# Patient Record
Sex: Female | Born: 1935 | Race: White | Hispanic: No | Smoking: Former smoker
Health system: Southern US, Community
[De-identification: ages and names within clinical notes are randomized; demographics above are authoritative.]

## PROBLEM LIST (undated history)

## (undated) DIAGNOSIS — I10 Essential (primary) hypertension: Secondary | ICD-10-CM

## (undated) DIAGNOSIS — M069 Rheumatoid arthritis, unspecified: Secondary | ICD-10-CM

## (undated) DIAGNOSIS — J449 Chronic obstructive pulmonary disease, unspecified: Secondary | ICD-10-CM

## (undated) HISTORY — DX: Essential (primary) hypertension: I10

## (undated) HISTORY — DX: Chronic obstructive pulmonary disease, unspecified: J44.9

## (undated) HISTORY — DX: Rheumatoid arthritis, unspecified: M06.9

---

## 2004-11-28 ENCOUNTER — Ambulatory Visit: Payer: Self-pay | Admitting: Ophthalmology

## 2004-12-02 ENCOUNTER — Ambulatory Visit: Payer: Self-pay | Admitting: Ophthalmology

## 2005-01-27 ENCOUNTER — Other Ambulatory Visit: Payer: Self-pay

## 2005-01-27 ENCOUNTER — Emergency Department: Payer: Self-pay | Admitting: Emergency Medicine

## 2006-02-09 ENCOUNTER — Emergency Department: Payer: Self-pay | Admitting: Emergency Medicine

## 2006-02-09 ENCOUNTER — Other Ambulatory Visit: Payer: Self-pay

## 2006-07-03 ENCOUNTER — Other Ambulatory Visit: Payer: Self-pay

## 2006-07-03 ENCOUNTER — Inpatient Hospital Stay: Payer: Self-pay | Admitting: Internal Medicine

## 2008-03-08 ENCOUNTER — Ambulatory Visit: Payer: Self-pay

## 2008-03-14 ENCOUNTER — Ambulatory Visit: Payer: Self-pay

## 2009-07-04 ENCOUNTER — Encounter: Payer: Self-pay | Admitting: Internal Medicine

## 2009-07-24 ENCOUNTER — Encounter: Payer: Self-pay | Admitting: Internal Medicine

## 2011-08-09 ENCOUNTER — Emergency Department: Payer: Self-pay | Admitting: *Deleted

## 2012-02-20 ENCOUNTER — Ambulatory Visit: Payer: Self-pay | Admitting: Internal Medicine

## 2012-02-20 LAB — URINALYSIS, COMPLETE
Bacteria: NONE SEEN
Bilirubin,UR: NEGATIVE
Blood: NEGATIVE
Glucose,UR: NEGATIVE mg/dL
Ketone: NEGATIVE
Leukocyte Esterase: NEGATIVE
Nitrite: NEGATIVE
Ph: 7
Protein: NEGATIVE
RBC,UR: 1 /HPF
Specific Gravity: 1.008
Squamous Epithelial: <1
WBC UR: 1 /HPF

## 2012-02-22 LAB — URINE CULTURE

## 2012-05-20 ENCOUNTER — Inpatient Hospital Stay: Payer: Self-pay | Admitting: Internal Medicine

## 2012-05-20 LAB — CBC
HCT: 43.8 % (ref 35.0–47.0)
MCHC: 34.1 g/dL (ref 32.0–36.0)
MCV: 94 fL (ref 80–100)
RBC: 4.64 10*6/uL (ref 3.80–5.20)
RDW: 12.9 % (ref 11.5–14.5)

## 2012-05-20 LAB — COMPREHENSIVE METABOLIC PANEL
Albumin: 4.6 g/dL (ref 3.4–5.0)
Alkaline Phosphatase: 79 U/L (ref 50–136)
Anion Gap: 9 (ref 7–16)
BUN: 13 mg/dL (ref 7–18)
Bilirubin,Total: 0.6 mg/dL (ref 0.2–1.0)
Chloride: 99 mmol/L (ref 98–107)
Co2: 28 mmol/L (ref 21–32)
Creatinine: 0.83 mg/dL (ref 0.60–1.30)
EGFR (Non-African Amer.): >60
Glucose: 116 mg/dL — ABNORMAL HIGH (ref 65–99)
Osmolality: 273 (ref 275–301)
Potassium: 3 mmol/L — ABNORMAL LOW (ref 3.5–5.1)
SGOT(AST): 35 U/L (ref 15–37)
Sodium: 136 mmol/L (ref 136–145)
Total Protein: 8.1 g/dL (ref 6.4–8.2)

## 2012-05-20 LAB — URINALYSIS, COMPLETE
Bilirubin,UR: NEGATIVE
Blood: NEGATIVE
Ketone: NEGATIVE
Leukocyte Esterase: NEGATIVE
Nitrite: NEGATIVE
Ph: 8 (ref 4.5–8.0)
Protein: 30
Specific Gravity: 1.004 (ref 1.003–1.030)
WBC UR: 1 /HPF (ref 0–5)

## 2012-05-20 LAB — TROPONIN I: Troponin-I: 0.03 ng/mL

## 2012-05-22 LAB — CBC WITH DIFFERENTIAL/PLATELET
Basophil #: 0 10*3/uL (ref 0.0–0.1)
Basophil %: 0.1 %
Eosinophil %: 0 %
HCT: 32.8 % — ABNORMAL LOW (ref 35.0–47.0)
Lymphocyte #: 0.6 10*3/uL — ABNORMAL LOW (ref 1.0–3.6)
Lymphocyte %: 8.4 %
MCHC: 33.7 g/dL (ref 32.0–36.0)
Monocyte %: 2.3 %
Neutrophil #: 6.6 10*3/uL — ABNORMAL HIGH (ref 1.4–6.5)
Platelet: 177 10*3/uL (ref 150–440)
RBC: 3.39 10*6/uL — ABNORMAL LOW (ref 3.80–5.20)
RDW: 12.9 % (ref 11.5–14.5)

## 2012-05-22 LAB — COMPREHENSIVE METABOLIC PANEL
Albumin: 3 g/dL — ABNORMAL LOW (ref 3.4–5.0)
Alkaline Phosphatase: 51 U/L (ref 50–136)
Anion Gap: 11 (ref 7–16)
BUN: 17 mg/dL (ref 7–18)
Co2: 21 mmol/L (ref 21–32)
EGFR (Non-African Amer.): 39 — ABNORMAL LOW
Glucose: 248 mg/dL — ABNORMAL HIGH (ref 65–99)
Potassium: 5.2 mmol/L — ABNORMAL HIGH (ref 3.5–5.1)
SGOT(AST): 19 U/L (ref 15–37)

## 2012-05-23 LAB — CBC WITH DIFFERENTIAL/PLATELET
Basophil #: 0 10*3/uL (ref 0.0–0.1)
Basophil %: 0.1 %
Eosinophil #: 0 10*3/uL (ref 0.0–0.7)
Eosinophil %: 0.2 %
HCT: 36.2 % (ref 35.0–47.0)
HGB: 12.1 g/dL (ref 12.0–16.0)
Lymphocyte %: 10.3 %
MCH: 32.3 pg (ref 26.0–34.0)
MCHC: 33.5 g/dL (ref 32.0–36.0)
MCV: 96 fL (ref 80–100)
Monocyte #: 0.8 x10 3/mm (ref 0.2–0.9)
Monocyte %: 6.7 %
Neutrophil #: 9.4 10*3/uL — ABNORMAL HIGH (ref 1.4–6.5)
RBC: 3.76 10*6/uL — ABNORMAL LOW (ref 3.80–5.20)
WBC: 11.3 10*3/uL — ABNORMAL HIGH (ref 3.6–11.0)

## 2012-05-23 LAB — BASIC METABOLIC PANEL
Anion Gap: 8 (ref 7–16)
Calcium, Total: 8.7 mg/dL (ref 8.5–10.1)
Chloride: 101 mmol/L (ref 98–107)
Co2: 28 mmol/L (ref 21–32)
EGFR (African American): >60
EGFR (Non-African Amer.): >60
Osmolality: 276 (ref 275–301)
Potassium: 4 mmol/L (ref 3.5–5.1)

## 2012-05-24 LAB — BASIC METABOLIC PANEL
Calcium, Total: 8.9 mg/dL (ref 8.5–10.1)
Chloride: 99 mmol/L (ref 98–107)
Co2: 28 mmol/L (ref 21–32)
Creatinine: 0.87 mg/dL (ref 0.60–1.30)
EGFR (African American): >60
EGFR (Non-African Amer.): >60
Glucose: 99 mg/dL (ref 65–99)
Potassium: 3.6 mmol/L (ref 3.5–5.1)
Sodium: 136 mmol/L (ref 136–145)

## 2012-05-24 LAB — URINALYSIS, COMPLETE
Bacteria: NONE SEEN
Glucose,UR: NEGATIVE mg/dL (ref 0–75)
Ketone: NEGATIVE
Specific Gravity: 1.004 (ref 1.003–1.030)
Squamous Epithelial: NONE SEEN
WBC UR: <1 /HPF (ref 0–5)

## 2012-05-25 ENCOUNTER — Encounter: Payer: Self-pay | Admitting: Internal Medicine

## 2012-05-26 LAB — URINE CULTURE

## 2012-10-31 ENCOUNTER — Ambulatory Visit: Payer: Self-pay | Admitting: Ophthalmology

## 2014-08-09 LAB — COMPREHENSIVE METABOLIC PANEL
ALT: 19 U/L
ANION GAP: 8 (ref 7–16)
AST: 41 U/L — AB (ref 15–37)
Albumin: 4.2 g/dL (ref 3.4–5.0)
Alkaline Phosphatase: 65 U/L
BILIRUBIN TOTAL: 0.4 mg/dL (ref 0.2–1.0)
BUN: 20 mg/dL — AB (ref 7–18)
CHLORIDE: 94 mmol/L — AB (ref 98–107)
CO2: 33 mmol/L — AB (ref 21–32)
CREATININE: 0.77 mg/dL (ref 0.60–1.30)
Calcium, Total: 9.4 mg/dL (ref 8.5–10.1)
EGFR (African American): >60
Glucose: 138 mg/dL — ABNORMAL HIGH (ref 65–99)
Osmolality: 275 (ref 275–301)
Potassium: 3.4 mmol/L — ABNORMAL LOW (ref 3.5–5.1)
SODIUM: 135 mmol/L — AB (ref 136–145)
Total Protein: 7.9 g/dL (ref 6.4–8.2)

## 2014-08-09 LAB — CBC
HCT: 36.3 % (ref 35.0–47.0)
HGB: 12.1 g/dL (ref 12.0–16.0)
MCH: 31.4 pg (ref 26.0–34.0)
MCHC: 33.2 g/dL (ref 32.0–36.0)
MCV: 95 fL (ref 80–100)
PLATELETS: 171 10*3/uL (ref 150–440)
RBC: 3.84 10*6/uL (ref 3.80–5.20)
RDW: 12.3 % (ref 11.5–14.5)
WBC: 7.4 10*3/uL (ref 3.6–11.0)

## 2014-08-09 LAB — TROPONIN I: Troponin-I: 0.08 ng/mL — ABNORMAL HIGH

## 2014-08-09 LAB — PROTIME-INR
INR: 1
Prothrombin Time: 12.6 secs (ref 11.5–14.7)

## 2014-08-09 LAB — APTT: Activated PTT: 28.9 secs (ref 23.6–35.9)

## 2014-08-09 LAB — CK TOTAL AND CKMB (NOT AT ARMC)
CK, Total: 118 U/L
CK-MB: 2 ng/mL (ref 0.5–3.6)

## 2014-08-10 ENCOUNTER — Observation Stay: Payer: Self-pay | Admitting: Internal Medicine

## 2014-08-10 DIAGNOSIS — I059 Rheumatic mitral valve disease, unspecified: Secondary | ICD-10-CM

## 2014-08-10 DIAGNOSIS — I10 Essential (primary) hypertension: Secondary | ICD-10-CM

## 2014-08-10 DIAGNOSIS — I200 Unstable angina: Secondary | ICD-10-CM

## 2014-08-10 LAB — URINALYSIS, COMPLETE
Bacteria: NONE SEEN
Bilirubin,UR: NEGATIVE
Blood: NEGATIVE
Glucose,UR: NEGATIVE mg/dL (ref 0–75)
Ketone: NEGATIVE
Leukocyte Esterase: NEGATIVE
Nitrite: NEGATIVE
PH: 7 (ref 4.5–8.0)
Protein: NEGATIVE
RBC,UR: NONE SEEN /HPF (ref 0–5)
SPECIFIC GRAVITY: 1.006 (ref 1.003–1.030)
Squamous Epithelial: NONE SEEN

## 2014-08-10 LAB — CK-MB
CK-MB: 2 ng/mL (ref 0.5–3.6)
CK-MB: 2.1 ng/mL (ref 0.5–3.6)

## 2014-08-10 LAB — HEPARIN LEVEL (UNFRACTIONATED)
Anti-Xa(Unfractionated): 0.73 IU/mL — ABNORMAL HIGH (ref 0.30–0.70)
Anti-Xa(Unfractionated): 0.41 IU/mL (ref 0.30–0.70)

## 2014-08-10 LAB — TROPONIN I
Troponin-I: 0.09 ng/mL — ABNORMAL HIGH
Troponin-I: 0.07 ng/mL — ABNORMAL HIGH

## 2014-08-11 LAB — CBC WITH DIFFERENTIAL/PLATELET
Basophil #: 0 10*3/uL (ref 0.0–0.1)
Basophil %: 0.5 %
Eosinophil #: 0.2 10*3/uL (ref 0.0–0.7)
Eosinophil %: 4.6 %
HCT: 28.2 % — ABNORMAL LOW (ref 35.0–47.0)
HGB: 9.1 g/dL — AB (ref 12.0–16.0)
Lymphocyte #: 1 10*3/uL (ref 1.0–3.6)
Lymphocyte %: 24.1 %
MCH: 31.5 pg (ref 26.0–34.0)
MCHC: 32.4 g/dL (ref 32.0–36.0)
MCV: 97 fL (ref 80–100)
MONOS PCT: 11 %
Monocyte #: 0.5 x10 3/mm (ref 0.2–0.9)
Neutrophil #: 2.5 10*3/uL (ref 1.4–6.5)
Neutrophil %: 59.8 %
Platelet: 135 10*3/uL — ABNORMAL LOW (ref 150–440)
RBC: 2.9 10*6/uL — AB (ref 3.80–5.20)
RDW: 12.6 % (ref 11.5–14.5)
WBC: 4.1 10*3/uL (ref 3.6–11.0)

## 2014-08-11 LAB — HEPARIN LEVEL (UNFRACTIONATED): ANTI-XA(UNFRACTIONATED): 0.36 [IU]/mL (ref 0.30–0.70)

## 2014-08-11 LAB — HEMOGLOBIN: HGB: 9.9 g/dL — ABNORMAL LOW (ref 12.0–16.0)

## 2014-08-13 ENCOUNTER — Encounter: Payer: Self-pay | Admitting: Cardiovascular Disease

## 2014-10-01 ENCOUNTER — Emergency Department: Payer: Self-pay | Admitting: Emergency Medicine

## 2014-10-01 LAB — URINALYSIS, COMPLETE
BACTERIA: NONE SEEN
Bilirubin,UR: NEGATIVE
Blood: NEGATIVE
Glucose,UR: NEGATIVE mg/dL (ref 0–75)
Ketone: NEGATIVE
LEUKOCYTE ESTERASE: NEGATIVE
Nitrite: NEGATIVE
Ph: 7 (ref 4.5–8.0)
Protein: NEGATIVE
SQUAMOUS EPITHELIAL: NONE SEEN
Specific Gravity: 1.008 (ref 1.003–1.030)
WBC UR: <1 /HPF (ref 0–5)

## 2014-10-01 LAB — COMPREHENSIVE METABOLIC PANEL
ALBUMIN: 4.5 g/dL (ref 3.4–5.0)
Alkaline Phosphatase: 61 U/L
Anion Gap: 7 (ref 7–16)
BUN: 12 mg/dL (ref 7–18)
Bilirubin,Total: 0.5 mg/dL (ref 0.2–1.0)
CHLORIDE: 95 mmol/L — AB (ref 98–107)
CO2: 34 mmol/L — AB (ref 21–32)
Calcium, Total: 9.8 mg/dL (ref 8.5–10.1)
Creatinine: 0.85 mg/dL (ref 0.60–1.30)
Glucose: 138 mg/dL — ABNORMAL HIGH (ref 65–99)
OSMOLALITY: 274 (ref 275–301)
Potassium: 3.5 mmol/L (ref 3.5–5.1)
SGOT(AST): 33 U/L (ref 15–37)
SGPT (ALT): 20 U/L
Sodium: 136 mmol/L (ref 136–145)
TOTAL PROTEIN: 7.5 g/dL (ref 6.4–8.2)

## 2014-10-01 LAB — CBC
HCT: 37.2 % (ref 35.0–47.0)
HGB: 12.3 g/dL (ref 12.0–16.0)
MCH: 31.7 pg (ref 26.0–34.0)
MCHC: 33.1 g/dL (ref 32.0–36.0)
MCV: 96 fL (ref 80–100)
Platelet: 176 10*3/uL (ref 150–440)
RBC: 3.88 10*6/uL (ref 3.80–5.20)
RDW: 12.9 % (ref 11.5–14.5)
WBC: 5 10*3/uL (ref 3.6–11.0)

## 2014-10-01 LAB — TROPONIN I
TROPONIN-I: 0.03 ng/mL
Troponin-I: 0.02 ng/mL

## 2014-10-08 ENCOUNTER — Telehealth: Payer: Self-pay

## 2014-10-08 NOTE — Telephone Encounter (Signed)
Attempted to call pt for Ed follow up, line continual busy

## 2014-10-08 NOTE — Telephone Encounter (Signed)
-----   Message from [place] OuchMuhammad A Arida, MD sent at 10/01/2014  1:27 PM EST ----- Needs follow up within 1 week for chest pain. She is in ED now.

## 2014-10-10 ENCOUNTER — Ambulatory Visit (INDEPENDENT_AMBULATORY_CARE_PROVIDER_SITE_OTHER): Payer: Medicare Other | Admitting: Cardiovascular Disease

## 2014-10-10 ENCOUNTER — Encounter: Payer: Self-pay | Admitting: Cardiovascular Disease

## 2014-10-10 ENCOUNTER — Encounter (INDEPENDENT_AMBULATORY_CARE_PROVIDER_SITE_OTHER): Payer: Self-pay

## 2014-10-10 VITALS — BP 150/86 | HR 80 | Ht 61.0 in | Wt 114.8 lb

## 2014-10-10 DIAGNOSIS — I10 Essential (primary) hypertension: Secondary | ICD-10-CM

## 2014-10-10 DIAGNOSIS — R079 Chest pain, unspecified: Secondary | ICD-10-CM | POA: Insufficient documentation

## 2014-10-10 DIAGNOSIS — I209 Angina pectoris, unspecified: Secondary | ICD-10-CM

## 2014-10-10 MED ORDER — ISOSORBIDE MONONITRATE ER 30 MG PO TB24: 30.0000 mg | ORAL_TABLET | Freq: Every day | ORAL | Status: DC

## 2014-10-10 MED ORDER — ISOSORBIDE MONONITRATE ER 30 MG PO TB24: 30.0000 mg | ORAL_TABLET | Freq: Every day | ORAL | Status: AC

## 2014-10-10 NOTE — Progress Notes (Signed)
   HPI  This is an 80 year old female who is here today for a follow-up visit regarding chest pain. She reports previous history of pericarditis many years ago. She has known history of COPD and rheumatoid arthritis. She lives in an assisted living facility. She was hospitalized in September for substernal chest tightness and shortness of breath . She was noted to be hypertensive on presentation and was found to have borderline elevated troponin . EKG showed no ischemic changes. Given her age and frail status, she was treated medically. Echocardiogram showed normal LV systolic function, mild to moderate mitral regurgitation, mild to moderate tricuspid regurgitation and mild pulmonary hypertension. She went to the emergency room a few days ago with similar substernal chest tightness which lasted for about 5-10 minutes. Basic workup was negative. She has not had recurrent episodes. She is overall a poor historian.  Allergies  Allergen Reactions  . Ace Inhibitors Hives  . Penicillins Rash     No current outpatient prescriptions on file prior to visit.   No current facility-administered medications on file prior to visit.     No past medical history on file.   No past surgical history on file.   No family history on file.   History   Social History  . Marital Status: Widowed    Spouse Name: N/A    Number of Children: N/A  . Years of Education: N/A   Occupational History  . Not on file.   Social History Main Topics  . Smoking status: Former Games developermoker  . Smokeless tobacco: Not on file  . Alcohol Use: Not on file  . Drug Use: Not on file  . Sexual Activity: Not on file   Other Topics Concern  . Not on file   Social History Narrative  . No narrative on file      PHYSICAL EXAM   BP 150/86 mmHg  Pulse 80  Ht 5\' 1"  (1.549 m)  Wt 114 lb 12.8 oz (52.073 kg)  BMI 21.70 kg/m2 Constitutional: She is oriented to person, place, and time. She appears well-developed and  well-nourished. No distress.  HENT: No nasal discharge.  Head: Normocephalic and atraumatic.  Eyes: Pupils are equal and round. No discharge.  Neck: Normal range of motion. Neck supple. No JVD present. No thyromegaly present.  Cardiovascular: Normal rate, regular rhythm, normal heart sounds. Exam reveals no gallop and no friction rub. No murmur heard.  Pulmonary/Chest: Effort normal and breath sounds normal. No stridor. No respiratory distress. She has no wheezes. She has no rales. She exhibits no tenderness.  Abdominal: Soft. Bowel sounds are normal. She exhibits no distension. There is no tenderness. There is no rebound and no guarding.  Musculoskeletal: Normal range of motion. She exhibits no edema and no tenderness.  Neurological: She is alert and oriented to person, place, and time. Coordination normal.  Skin: Skin is warm and dry. No rash noted. She is not diaphoretic. No erythema. No pallor.  Psychiatric: She has a normal mood and affect. Her behavior is normal. Judgment and thought content normal.     EKG: recent EKG showed sinus rhythm with no significant ST or T wave changes.   ASSESSMENT AND PLAN

## 2014-10-10 NOTE — Assessment & Plan Note (Signed)
The chest pain has some angina with some atypical features although she is overall a poor historian. Given her age, advanced COPD and frail status, I elected for conservative approach. I added Imdur 30 mg once daily today. Continue other medications. Further ischemic cardiac evaluation can be considered if she continues to have recurrent chest pain.

## 2014-10-10 NOTE — Patient Instructions (Signed)
Your physician has recommended you make the following change in your medication:  Imdur 30 mg once daily    Your physician recommends that you schedule a follow-up appointment in:  3 months with Dr. Kirke CorinArida

## 2014-10-10 NOTE — Assessment & Plan Note (Signed)
Blood pressure is elevated. Imdur was added.

## 2014-12-02 ENCOUNTER — Inpatient Hospital Stay: Payer: Self-pay | Admitting: Internal Medicine

## 2014-12-02 LAB — URINALYSIS, COMPLETE
BILIRUBIN, UR: NEGATIVE
Glucose,UR: 50 mg/dL (ref 0–75)
Hyaline Cast: 2
LEUKOCYTE ESTERASE: NEGATIVE
Nitrite: NEGATIVE
Ph: 6 (ref 4.5–8.0)
Protein: 100
RBC,UR: <1 /HPF (ref 0–5)
Specific Gravity: 1.015 (ref 1.003–1.030)

## 2014-12-02 LAB — COMPREHENSIVE METABOLIC PANEL
ALT: 34 U/L
ANION GAP: 7 (ref 7–16)
Albumin: 4.4 g/dL (ref 3.4–5.0)
Alkaline Phosphatase: 60 U/L
BUN: 19 mg/dL — ABNORMAL HIGH (ref 7–18)
Bilirubin,Total: 0.7 mg/dL (ref 0.2–1.0)
CALCIUM: 9.7 mg/dL (ref 8.5–10.1)
CO2: 34 mmol/L — AB (ref 21–32)
Chloride: 91 mmol/L — ABNORMAL LOW (ref 98–107)
Creatinine: 0.97 mg/dL (ref 0.60–1.30)
EGFR (African American): >60
EGFR (Non-African Amer.): 58 — ABNORMAL LOW
Glucose: 238 mg/dL — ABNORMAL HIGH (ref 65–99)
Osmolality: 275 (ref 275–301)
Potassium: 3.7 mmol/L (ref 3.5–5.1)
SGOT(AST): 61 U/L — ABNORMAL HIGH (ref 15–37)
Sodium: 132 mmol/L — ABNORMAL LOW (ref 136–145)
Total Protein: 7.8 g/dL (ref 6.4–8.2)

## 2014-12-02 LAB — CBC
HCT: 42.4 % (ref 35.0–47.0)
HGB: 13.9 g/dL (ref 12.0–16.0)
MCH: 30.9 pg (ref 26.0–34.0)
MCHC: 32.7 g/dL (ref 32.0–36.0)
MCV: 95 fL (ref 80–100)
Platelet: 237 10*3/uL (ref 150–440)
RBC: 4.49 10*6/uL (ref 3.80–5.20)
RDW: 12.8 % (ref 11.5–14.5)
WBC: 14.3 10*3/uL — ABNORMAL HIGH (ref 3.6–11.0)

## 2014-12-02 LAB — TROPONIN I: TROPONIN-I: 0.04 ng/mL

## 2014-12-02 LAB — LIPASE, BLOOD: LIPASE: 222 U/L (ref 73–393)

## 2014-12-03 LAB — COMPREHENSIVE METABOLIC PANEL
ALBUMIN: 3.1 g/dL — AB (ref 3.4–5.0)
ALK PHOS: 40 U/L — AB
Anion Gap: 7 (ref 7–16)
BILIRUBIN TOTAL: 0.7 mg/dL (ref 0.2–1.0)
BUN: 11 mg/dL (ref 7–18)
Calcium, Total: 7.9 mg/dL — ABNORMAL LOW (ref 8.5–10.1)
Chloride: 99 mmol/L (ref 98–107)
Co2: 30 mmol/L (ref 21–32)
Creatinine: 0.75 mg/dL (ref 0.60–1.30)
EGFR (African American): >60
EGFR (Non-African Amer.): >60
GLUCOSE: 109 mg/dL — AB (ref 65–99)
Osmolality: 272 (ref 275–301)
Potassium: 3 mmol/L — ABNORMAL LOW (ref 3.5–5.1)
SGOT(AST): 32 U/L (ref 15–37)
SGPT (ALT): 20 U/L
Sodium: 136 mmol/L (ref 136–145)
Total Protein: 5.7 g/dL — ABNORMAL LOW (ref 6.4–8.2)

## 2014-12-03 LAB — CBC WITH DIFFERENTIAL/PLATELET
BASOS ABS: 0 10*3/uL (ref 0.0–0.1)
BASOS PCT: 0.1 %
Eosinophil #: 0 10*3/uL (ref 0.0–0.7)
Eosinophil %: 0.1 %
HCT: 34.4 % — ABNORMAL LOW (ref 35.0–47.0)
HGB: 11.2 g/dL — AB (ref 12.0–16.0)
LYMPHS PCT: 4.6 %
Lymphocyte #: 0.6 10*3/uL — ABNORMAL LOW (ref 1.0–3.6)
MCH: 31 pg (ref 26.0–34.0)
MCHC: 32.6 g/dL (ref 32.0–36.0)
MCV: 95 fL (ref 80–100)
Monocyte #: 1 x10 3/mm — ABNORMAL HIGH (ref 0.2–0.9)
Monocyte %: 8.2 %
NEUTROS PCT: 87 %
Neutrophil #: 10.9 10*3/uL — ABNORMAL HIGH (ref 1.4–6.5)
Platelet: 167 10*3/uL (ref 150–440)
RBC: 3.61 10*6/uL — ABNORMAL LOW (ref 3.80–5.20)
RDW: 12.4 % (ref 11.5–14.5)
WBC: 12.5 10*3/uL — ABNORMAL HIGH (ref 3.6–11.0)

## 2014-12-03 LAB — HEMOGLOBIN: HGB: 10.9 g/dL — ABNORMAL LOW (ref 12.0–16.0)

## 2014-12-04 LAB — CBC WITH DIFFERENTIAL/PLATELET
BASOS ABS: 0 10*3/uL (ref 0.0–0.1)
Basophil %: 0.2 %
EOS ABS: 0.2 10*3/uL (ref 0.0–0.7)
Eosinophil %: 1.3 %
HCT: 34 % — AB (ref 35.0–47.0)
HGB: 11.2 g/dL — AB (ref 12.0–16.0)
LYMPHS PCT: 8.7 %
Lymphocyte #: 1.1 10*3/uL (ref 1.0–3.6)
MCH: 31.4 pg (ref 26.0–34.0)
MCHC: 32.9 g/dL (ref 32.0–36.0)
MCV: 95 fL (ref 80–100)
Monocyte #: 1.1 x10 3/mm — ABNORMAL HIGH (ref 0.2–0.9)
Monocyte %: 8.3 %
NEUTROS ABS: 10.3 10*3/uL — AB (ref 1.4–6.5)
NEUTROS PCT: 81.5 %
PLATELETS: 174 10*3/uL (ref 150–440)
RBC: 3.57 10*6/uL — ABNORMAL LOW (ref 3.80–5.20)
RDW: 13.1 % (ref 11.5–14.5)
WBC: 12.7 10*3/uL — AB (ref 3.6–11.0)

## 2014-12-04 LAB — BASIC METABOLIC PANEL
Anion Gap: 5 — ABNORMAL LOW (ref 7–16)
BUN: 8 mg/dL (ref 7–18)
Calcium, Total: 7.9 mg/dL — ABNORMAL LOW (ref 8.5–10.1)
Chloride: 103 mmol/L (ref 98–107)
Co2: 28 mmol/L (ref 21–32)
Creatinine: 0.88 mg/dL (ref 0.60–1.30)
EGFR (African American): >60
EGFR (Non-African Amer.): >60
GLUCOSE: 100 mg/dL — AB (ref 65–99)
Osmolality: 270 (ref 275–301)
POTASSIUM: 3.3 mmol/L — AB (ref 3.5–5.1)
SODIUM: 136 mmol/L (ref 136–145)

## 2014-12-05 LAB — BASIC METABOLIC PANEL WITH GFR
Anion Gap: 6 — ABNORMAL LOW
BUN: 6 mg/dL — ABNORMAL LOW
Calcium, Total: 7.9 mg/dL — ABNORMAL LOW
Chloride: 106 mmol/L
Co2: 26 mmol/L
Creatinine: 0.84 mg/dL
EGFR (African American): >60
EGFR (Non-African Amer.): >60
Glucose: 100 mg/dL — ABNORMAL HIGH
Osmolality: 273
Potassium: 3.3 mmol/L — ABNORMAL LOW
Sodium: 138 mmol/L

## 2014-12-05 LAB — CBC WITH DIFFERENTIAL/PLATELET
Basophil #: 0 x10 3/mm 3
Basophil %: 0.5 %
Eosinophil #: 0.2 x10 3/mm 3
Eosinophil %: 1.9 %
HCT: 32.8 % — ABNORMAL LOW
HGB: 10.9 g/dL — ABNORMAL LOW
Lymphocyte %: 9.6 %
Lymphs Abs: 1 x10 3/mm 3
MCH: 31.9 pg
MCHC: 33.1 g/dL
MCV: 96 fL
Monocyte #: 0.9 "x10 3/mm "
Monocyte %: 8.8 %
Neutrophil #: 8.3 x10 3/mm 3 — ABNORMAL HIGH
Neutrophil %: 79.2 %
Platelet: 175 x10 3/mm 3
RBC: 3.41 X10 6/mm 3 — ABNORMAL LOW
RDW: 12.8 %
WBC: 10.4 x10 3/mm 3

## 2014-12-06 LAB — CBC WITH DIFFERENTIAL/PLATELET
Basophil #: 0 10*3/uL (ref 0.0–0.1)
Basophil %: 0.5 %
EOS ABS: 0.2 10*3/uL (ref 0.0–0.7)
Eosinophil %: 3.6 %
HCT: 30.4 % — ABNORMAL LOW (ref 35.0–47.0)
HGB: 10.1 g/dL — ABNORMAL LOW (ref 12.0–16.0)
Lymphocyte #: 0.8 10*3/uL — ABNORMAL LOW (ref 1.0–3.6)
Lymphocyte %: 12.6 %
MCH: 31.7 pg (ref 26.0–34.0)
MCHC: 33.2 g/dL (ref 32.0–36.0)
MCV: 96 fL (ref 80–100)
Monocyte #: 0.7 x10 3/mm (ref 0.2–0.9)
Monocyte %: 10.5 %
NEUTROS ABS: 4.8 10*3/uL (ref 1.4–6.5)
Neutrophil %: 72.8 %
PLATELETS: 178 10*3/uL (ref 150–440)
RBC: 3.17 10*6/uL — ABNORMAL LOW (ref 3.80–5.20)
RDW: 13 % (ref 11.5–14.5)
WBC: 6.6 10*3/uL (ref 3.6–11.0)

## 2014-12-06 LAB — BASIC METABOLIC PANEL
ANION GAP: 5 — AB (ref 7–16)
BUN: 4 mg/dL — AB (ref 7–18)
CALCIUM: 7.7 mg/dL — AB (ref 8.5–10.1)
Chloride: 108 mmol/L — ABNORMAL HIGH (ref 98–107)
Co2: 27 mmol/L (ref 21–32)
Creatinine: 0.73 mg/dL (ref 0.60–1.30)
EGFR (African American): >60
EGFR (Non-African Amer.): >60
GLUCOSE: 127 mg/dL — AB (ref 65–99)
Osmolality: 278 (ref 275–301)
Potassium: 3.4 mmol/L — ABNORMAL LOW (ref 3.5–5.1)
Sodium: 140 mmol/L (ref 136–145)

## 2014-12-06 LAB — MAGNESIUM: MAGNESIUM: 1.3 mg/dL — AB

## 2014-12-07 LAB — CBC WITH DIFFERENTIAL/PLATELET
BASOS ABS: 0 10*3/uL (ref 0.0–0.1)
BASOS PCT: 0.4 %
EOS ABS: 0.2 10*3/uL (ref 0.0–0.7)
Eosinophil %: 3.9 %
HCT: 30.3 % — AB (ref 35.0–47.0)
HGB: 10 g/dL — ABNORMAL LOW (ref 12.0–16.0)
Lymphocyte #: 0.8 10*3/uL — ABNORMAL LOW (ref 1.0–3.6)
Lymphocyte %: 13.1 %
MCH: 31.7 pg (ref 26.0–34.0)
MCHC: 33.1 g/dL (ref 32.0–36.0)
MCV: 96 fL (ref 80–100)
MONO ABS: 0.9 x10 3/mm (ref 0.2–0.9)
Monocyte %: 14.8 %
Neutrophil #: 4.3 10*3/uL (ref 1.4–6.5)
Neutrophil %: 67.8 %
Platelet: 194 10*3/uL (ref 150–440)
RBC: 3.16 10*6/uL — ABNORMAL LOW (ref 3.80–5.20)
RDW: 13 % (ref 11.5–14.5)
WBC: 6.4 10*3/uL (ref 3.6–11.0)

## 2014-12-07 LAB — BASIC METABOLIC PANEL
Anion Gap: 4 — ABNORMAL LOW (ref 7–16)
BUN: 6 mg/dL — AB (ref 7–18)
CHLORIDE: 105 mmol/L (ref 98–107)
CO2: 28 mmol/L (ref 21–32)
Calcium, Total: 7.9 mg/dL — ABNORMAL LOW (ref 8.5–10.1)
Creatinine: 0.7 mg/dL (ref 0.60–1.30)
EGFR (Non-African Amer.): >60
Glucose: 150 mg/dL — ABNORMAL HIGH (ref 65–99)
OSMOLALITY: 274 (ref 275–301)
Potassium: 3.8 mmol/L (ref 3.5–5.1)
Sodium: 137 mmol/L (ref 136–145)

## 2014-12-08 LAB — CBC WITH DIFFERENTIAL/PLATELET
Basophil #: 0 10*3/uL (ref 0.0–0.1)
Basophil %: 0.9 %
Eosinophil #: 0.2 10*3/uL (ref 0.0–0.7)
Eosinophil %: 4.5 %
HCT: 32.2 % — ABNORMAL LOW (ref 35.0–47.0)
HGB: 10.4 g/dL — ABNORMAL LOW (ref 12.0–16.0)
LYMPHS PCT: 16.4 %
Lymphocyte #: 0.9 10*3/uL — ABNORMAL LOW (ref 1.0–3.6)
MCH: 31 pg (ref 26.0–34.0)
MCHC: 32.4 g/dL (ref 32.0–36.0)
MCV: 96 fL (ref 80–100)
MONOS PCT: 13.3 %
Monocyte #: 0.7 x10 3/mm (ref 0.2–0.9)
Neutrophil #: 3.5 10*3/uL (ref 1.4–6.5)
Neutrophil %: 64.9 %
PLATELETS: 205 10*3/uL (ref 150–440)
RBC: 3.35 10*6/uL — AB (ref 3.80–5.20)
RDW: 13.1 % (ref 11.5–14.5)
WBC: 5.3 10*3/uL (ref 3.6–11.0)

## 2014-12-08 LAB — BASIC METABOLIC PANEL
Anion Gap: 5 — ABNORMAL LOW (ref 7–16)
BUN: 5 mg/dL — ABNORMAL LOW (ref 7–18)
CO2: 32 mmol/L (ref 21–32)
CREATININE: 0.69 mg/dL (ref 0.60–1.30)
Calcium, Total: 8.4 mg/dL — ABNORMAL LOW (ref 8.5–10.1)
Chloride: 104 mmol/L (ref 98–107)
EGFR (African American): >60
EGFR (Non-African Amer.): >60
GLUCOSE: 112 mg/dL — AB (ref 65–99)
Osmolality: 279 (ref 275–301)
Potassium: 3.7 mmol/L (ref 3.5–5.1)
Sodium: 141 mmol/L (ref 136–145)

## 2014-12-09 ENCOUNTER — Encounter: Payer: Self-pay | Admitting: Internal Medicine

## 2014-12-24 ENCOUNTER — Encounter: Payer: Self-pay | Admitting: Internal Medicine

## 2015-01-10 ENCOUNTER — Ambulatory Visit: Payer: Medicare Other | Admitting: Cardiovascular Disease

## 2015-01-22 ENCOUNTER — Encounter
Admit: 2015-01-22 | Disposition: A | Discharge status: Home / Self Care | Payer: Self-pay | Attending: Internal Medicine | Admitting: Internal Medicine

## 2015-02-22 ENCOUNTER — Encounter
Admit: 2015-02-22 | Disposition: A | Discharge status: Home / Self Care | Payer: Self-pay | Attending: Internal Medicine | Admitting: Internal Medicine

## 2015-02-28 LAB — URINALYSIS, COMPLETE
Bilirubin,UR: NEGATIVE
Blood: NEGATIVE
GLUCOSE, UR: NEGATIVE mg/dL (ref 0–75)
KETONE: NEGATIVE
Nitrite: NEGATIVE
Ph: 6 (ref 4.5–8.0)
Protein: NEGATIVE
Specific Gravity: 1.005 (ref 1.003–1.030)

## 2015-03-03 LAB — URINE CULTURE

## 2015-03-11 ENCOUNTER — Ambulatory Visit: Payer: BC Managed Care – PPO | Admitting: Cardiovascular Disease

## 2015-03-12 LAB — CBC WITH DIFFERENTIAL/PLATELET
Basophil #: 0.1 10*3/uL (ref 0.0–0.1)
Basophil %: 0.9 %
EOS PCT: 2.8 %
Eosinophil #: 0.2 10*3/uL (ref 0.0–0.7)
HCT: 36.5 % (ref 35.0–47.0)
HGB: 12.6 g/dL (ref 12.0–16.0)
LYMPHS ABS: 1.2 10*3/uL (ref 1.0–3.6)
Lymphocyte %: 21 %
MCH: 31.8 pg (ref 26.0–34.0)
MCHC: 34.5 g/dL (ref 32.0–36.0)
MCV: 92 fL (ref 80–100)
MONOS PCT: 12.4 %
Monocyte #: 0.7 x10 3/mm (ref 0.2–0.9)
NEUTROS ABS: 3.7 10*3/uL (ref 1.4–6.5)
Neutrophil %: 62.9 %
Platelet: 193 10*3/uL (ref 150–440)
RBC: 3.96 10*6/uL (ref 3.80–5.20)
RDW: 12.9 % (ref 11.5–14.5)
WBC: 5.9 10*3/uL (ref 3.6–11.0)

## 2015-03-12 LAB — BASIC METABOLIC PANEL
ANION GAP: 4 — AB (ref 7–16)
BUN: 21 mg/dL — ABNORMAL HIGH
CALCIUM: 9.3 mg/dL
Chloride: 97 mmol/L — ABNORMAL LOW
Co2: 36 mmol/L — ABNORMAL HIGH
Creatinine: 0.76 mg/dL
EGFR (African American): >60
EGFR (Non-African Amer.): >60
GLUCOSE: 91 mg/dL
POTASSIUM: 3.3 mmol/L — AB
Sodium: 137 mmol/L

## 2015-03-12 NOTE — Op Note (Signed)
PATIENT NAME:  Lauren StakesWOOD, [name] MR#:  [id number] DATE OF BIRTH:  03/04/1925  DATE OF PROCEDURE:  10/31/2012  PREOPERATIVE DIAGNOSIS:  Cataract, left eye.    POSTOPERATIVE DIAGNOSIS:  Cataract, left eye.  PROCEDURE PERFORMED:  Extracapsular cataract extraction using phacoemulsification with placement of an Alcon SN6CWS, 22.0-diopter posterior chamber lens, serial # [id number].  SURGEON:  Maylon PeppersSteven A. Hilario Robarts, MD  ASSISTANT:  None.  ANESTHESIA:  4% lidocaine and 0.75% Marcaine in a 50/50 mixture with 10 units/mL of Hylenex added, given as peribulbar.  ANESTHESIOLOGIST:  Dr. Darleene CleaverVan Staveren   COMPLICATIONS:  None.  ESTIMATED BLOOD LOSS:  Less than 1 mL.  DESCRIPTION OF PROCEDURE:  The patient was brought to the operating room and given a peribulbar block.  The patient was then prepped and draped in the usual fashion.  The vertical rectus muscles were imbricated using 5-0 silk sutures.  These sutures were then clamped to the sterile drapes as bridle sutures.  A limbal peritomy was performed extending two clock hours and hemostasis was obtained with cautery.  A partial thickness scleral groove was made at the surgical limbus and dissected anteriorly in a lamellar dissection using an Alcon crescent knife.  The anterior chamber was entered supero-temporally with a Superblade and through the lamellar dissection with a 2.6 mm keratome.  DisCoVisc was used to replace the aqueous and a continuous tear capsulorrhexis was carried out.  Hydrodissection and hydrodelineation were carried out with balanced salt and a 27 gauge canula.  The nucleus was rotated to confirm the effectiveness of the hydrodissection.  Phacoemulsification was carried out using a divide-and-conquer technique.  Total ultrasound time was 1 minute and 56.8 seconds with an average power of 22.8 percent, CDE 46.56.  Irrigation/aspiration was used to remove the residual cortex.  DisCoVisc was used to inflate the capsule and the internal  incision was enlarged to 3 mm with the crescent knife.  The intraocular lens was folded and inserted into the capsular bag using the AcrySert delivery system.  Irrigation/aspiration was used to remove the residual DisCoVisc.  Miostat was injected into the anterior chamber through the paracentesis track to inflate the anterior chamber and induce miosis.  The wound was checked for leaks and none were found. The conjunctiva was closed with cautery and the bridle sutures were removed.  Two drops of 0.3% Vigamox were placed on the eye.   An eye shield was placed on the eye.  The patient was discharged to the recovery room in good condition.  ____________________________ Maylon PeppersSteven A. Robynn Marcel, MD sad:drc D: 10/31/2012 12:49:57 ET T: 10/31/2012 13:01:43 ET JOB#: [id number]  cc: Viviann SpareSteven A. Kofi Murrell, MD, <Dictator> Erline LevineSTEVEN A Yeray Tomas MD ELECTRONICALLY SIGNED 11/07/2012 13:19

## 2015-03-16 NOTE — Consult Note (Signed)
PATIENT NAME:  [name], [name] MR#:  [id number] DATE OF BIRTH:  August 22, 1925  DATE OF CONSULTATION:  08/10/2014  REFERRING PHYSICIAN:   CONSULTING PHYSICIAN:  Muhammad A. Kirke Corin, MD  PRIMARY CARE PHYSICIAN:  Dr. Dareen Piano.     REASON FOR CONSULTATION: Chest pain with elevated troponin.   HISTORY OF PRESENT ILLNESS: This is an 81 year old female who reports previous history of pericarditis many years ago. She has known history of COPD and rheumatoid arthritis. She lives in an assisted living facility. She presented to the Emergency Room after she had a prolonged episode of substernal chest tightness with no radiation. This was associated with mild shortness of breath but no other symptoms. She reports recent episodes of chest discomfort with activities. She does have COPD and uses inhalers. She was found to have borderline elevated troponin which has been mostly flat. EKG showed no ischemic changes. She still has very mild discomfort at the present time.   PAST MEDICAL HISTORY:  1.  COPD.   2.  Rheumatoid arthritis.   PAST SURGICAL HISTORY: Includes kidney stone removal.   FAMILY HISTORY: Hypertension, but no coronary artery disease.    SOCIAL HISTORY: She is a former smoker. She drinks wine every evening and lives in an independent living facility.   HOME MEDICATIONS:  Advair twice daily, amlodipine 5 mg daily, aspirin 81 mg daily, calcium and vitamin D, citalopram 40 mg daily, lorazepam, metoprolol succinate 25 mg twice daily, omeprazole 20 mg daily, Senokot, Spiriva twice daily, tramadol, and Vytorin.   ALLERGIES: INCLUDE ACE INHIBITORS AND PENICILLIN.   REVIEW OF SYSTEMS: A 10 point review of systems review of systems was performed. It is negative other than what is mentioned in the HPI.    PHYSICAL EXAMINATION:   GENERAL: This is a frail old lady who is currently in no acute distress.  VITAL SIGNS: Temperature is 97.6, pulse is 81, respiratory rate is 18, blood pressure is 172/72, oxygen  saturation is 94% on 3 liters nasal cannula.  HEENT: Normocephalic, atraumatic.  NECK: No JVD or carotid bruits.  RESPIRATORY: Normal respiratory effort with no use of accessory muscles. Auscultation reveals normal breath sounds.  CARDIOVASCULAR: Normal PMI. Normal S1 and S2 with no gallops or murmurs.  ABDOMEN: Benign, nontender, and nondistended.  EXTREMITIES: With no clubbing, cyanosis, or edema.  SKIN: Warm and dry with no rash.  PSYCHIATRIC: She is alert, oriented x 3 with normal mood and affect.   LABORATORY AND DIAGNOSTIC DATA: Laboratories showed a creatinine of 0.77. Troponin was 0.08, and most recent was 0.07. CK-MB was normal. CBC was unremarkable. ECG showed normal sinus rhythm without significant ST or T wave changes.   IMPRESSION:  1.  Unstable angina.   2.  Uncontrolled hypertension.  3.  Chronic obstructive pulmonary disease.   RECOMMENDATIONS: The patient's presentation and slightly elevated troponin are suggestive of unstable angina. Currently she is on a heparin drip as well as aspirin, a beta blocker, and simvastatin. I recommend continuing current management. I discussed the management options with the patient and daughter including invasive stratification versus medical therapy. Given her age and frail status, the patient prefers to be treated medically and will only consider further evaluation if symptoms worsen. I requested an echocardiogram to evaluate LV systolic function and wall motion. Given that she continues to have mild chest discomfort, I added Imdur 30 mg daily. I recommend continuing heparin drip until tomorrow. Recommend ambulation tomorrow and monitoring symptoms. She can likely be discharged home tomorrow if she  is stable.    ____________________________ Chelsea AusMuhammad A. Kirke CorinArida, MD maa:bu D: 08/10/2014 12:49:53 ET T: 08/10/2014 12:59:48 ET JOB#: [id number]  cc: Muhammad A. Kirke CorinArida, MD, <Dictator> [place] OuchMUHAMMAD A ARIDA MD ELECTRONICALLY SIGNED 09/03/2014 18:02

## 2015-03-16 NOTE — Discharge Summary (Signed)
PATIENT NAME:  [name], Aunisty B MR#:  [id number] DATE OF BIRTH:  [birth year]  DATE OF ADMISSION:  08/10/2014 DATE OF DISCHARGE:  08/11/2014  PRIMARY CARE PHYSICIAN: Dr.  Einar CrowMarshall Anderson.    FINAL DIAGNOSES:  1. Chest pain with borderline elevated troponin.  2. Hypertension.  3. Chronic obstructive pulmonary disease with chronic respiratory failure on home oxygen.  4. Anemia.   MEDICATIONS ON DISCHARGE: Include Vytorin 10/20 one tablet daily, calcium and vitamin D 600 mg/200 international units 2 tablets twice a day, Centrum Silver 1 tablet daily, aspirin 81 mg daily, ProAir HFA 2 puffs 4 times a day, Spiriva 1 inhalation daily, triamcinolone applied to affected area as needed for itching, metoprolol 25 mg extended-release 1 tablet twice a day, Advair Diskus 100/50 one puff twice a day,  Flucinolone topical applied to affected area once a day as needed for itching, amlodipine 5 mg daily, Senokot 1 capsule twice a day with meals, ipratropium nasal 2 sprays twice a day, Celexa 10 mg daily, furosemide 20 mg every other day, Imdur 30 mg extended-release daily, nitroglycerin 0.4 mg sublingual as needed for chest pain maximum 3 tablets. Stop taking losartan, oxygen 2 liters nasal cannula.   DIET: Low sodium diet, regular consistency.   Follow-up with Dr. Kirke CorinArida of cardiology and in 1 to 2 weeks with Dr. Einar CrowMarshall Anderson.   HOSPITAL COURSE: The patient was admitted 08/10/2014 and discharged 08/11/2014, came in with chest pain, and found to have a borderline cardiac enzyme. Laboratory and radiological data during the hospital course included a chest x-ray that showed hyperinflation, cardiomegaly, wedge T6 compression fracture indeterminate age. INR 1.0, troponin borderline at 0.08, glucose 138, BUN 20, creatinine 0.77, sodium 135, potassium 3.4, chloride 94, CO2 of 33. Liver function tests: AST slightly elevated at 41. White blood cell count 7.4, hemoglobin and hematocrit 12.1 and 36.3. Platelet count 171,000.  Urinalysis negative, next troponin borderline at 0.09, next troponin borderline at 0.07. Echocardiogram showed an EF of 60% to 65% mildly dilated left atrium, mild to moderate mitral valve regurgitation, mild to moderate tricuspid regurgitation. Repeat hemoglobin was down to 9.1, but I repeated it after that and it was 9.9.  HOSPITAL COURSE PER PROBLEM LIST:  1. Chest pain, borderline elevated troponin. The patient was seen in consultation by Dr. Kirke CorinArida of cardiology and Dr. Mariah MillingGollan ordered by the admitting physician. The patient was stable for discharge home. No further chest pain. We did add Imdur to the regimen and p.r.n. nitroglycerin. This still could be angina. Further follow up as outpatient as needed.  2. Hypertension. Blood pressure lower on the left arm than it is on the right arm. Follow up as outpatient. I did cut off the losartan, can consider restarting as outpatient.  3. Chronic obstructive pulmonary disease with chronic respiratory failure on home oxygen. Continue oxygen supplementation as outpatient.  4. Anemia. I think this is partially dilutional. The patient was receiving IV fluids overnight and also heparin drip. I repeated a third hemoglobin which was higher than the second one after I stopped the IV fluids.   TIME SPENT ON DISCHARGE: 35 minutes.    ____________________________ Herschell Dimesichard J. Renae GlossWieting, MD rjw:JT D: 08/11/2014 16:16:22 ET T: 08/11/2014 22:56:52 ET JOB#: [id number]  cc: Herschell Dimesichard J. Renae GlossWieting, MD, <Dictator> Marya AmslerMarshall W. Dareen PianoAnderson, MD Salley ScarletICHARD J Mattia Osterman MD ELECTRONICALLY SIGNED 08/16/2014 13:58

## 2015-03-16 NOTE — Consult Note (Signed)
Brief Consult Note: Diagnosis: unstable angina.   Patient was seen by consultant.   Consult note dictated.   Orders entered.   Discussed with Attending MD.   Comments: continue Heparin until tomorrow.  I ordered echo.  Discussed management options with patient and daughter. They prefer medical management for now.  I added Imdur given that she sill has mild chest pain.  Ambulate tomorrow and possible discharge.  Electronic Signatures: Lorine BearsArida, Muhammad (MD)  (Signed 18-Sep-15 12:44)  Authored: Brief Consult Note   Last Updated: 18-Sep-15 12:44 by Lorine BearsArida, Muhammad (MD)

## 2015-03-16 NOTE — H&P (Signed)
PATIENT NAME:  [name], FLATEN MR#:  [id number] DATE OF BIRTH:  [birth year]  DATE OF ADMISSION:  08/10/2014  REFERRING PHYSICIAN: Dr. Derrill Kay.   PRIMARY CARE PHYSICIAN: Dr. Dareen Piano.   ADMISSION DIAGNOSIS: Chest pain.   HISTORY OF PRESENT ILLNESS: This is an 81 year old Caucasian female who presents to presents to the Emergency Department complaining of chest pain that began approximately 9 hours prior to arrival in the Emergency Department. The patient states that she got up to urinate. She successfully used the bathroom, but as she was rising from the commode she began to feel chest pain that was bilateral in her upper chest and radiated in a linear pattern down her rib cage to her lower ribs. The pain was pressure in quality. Initially the pain was severe, but rapidly decreased until it was only a "raw" type pain in her right lower chest. The patient describes that distribution of pain as a circular area under her right breast. The patient states that this secondary nagging pain lasted all afternoon during which time she was sitting down and resting while watching TV. She denies feeling dizzy and nauseous or diaphoretic but admits that "felt like her legs could not hold me up." Upon arrival to the Emergency Department the patient was found to have a mildly elevated troponin, which prompted Emergency Department staff to call for admission.   REVIEW OF SYSTEMS:  CONSTITUTIONAL: The patient denies fever or weakness.  EYES: The patient denies double vision or inflammation.  EARS, NOSE AND THROAT: The patient denies tinnitus or difficulty swallowing.  RESPIRATORY: The patient denies cough or shortness of breath.  CARDIOVASCULAR: The patient admits to chest pain but denies palpitations or orthopnea.  GASTROINTESTINAL: The patient denies nausea, vomiting, diarrhea or abdominal pain.  GENITOURINARY: The patient denies dysuria, increased frequency or hesitancy.  ENDOCRINE: The patient denies polyuria or  polydipsia.  HEMATOLOGIC AND LYMPHATIC: The patient denies easy bruising or bleeding.  INTEGUMENTARY: The patient denies rashes or lesions.  MUSCULOSKELETAL: The patient denies arthralgias or myalgias.  NEUROLOGIC: The patient denies numbness in her extremities or difficulty speaking.  PSYCHIATRIC: The patient denies depression or suicidal ideation.   PAST MEDICAL HISTORY: COPD and what appears to be rheumatoid arthritis.   PAST SURGICAL HISTORY: Kidney stone removal more than 10 years ago.   FAMILY HISTORY: Hypertension in her mother  SOCIAL HISTORY: The patient is a former smoker. She drinks two glasses of  wine every evening and she is in independent living resident of [address] [facility].   MEDICATIONS:  1. Advair 100 mcg/50 mcg 1 inhalation b.i.d.  2. Amlodipine 5 mg 1 tab p.o. daily.  3. Aspirin 81 mg 1 tab p.o. daily.  4. Calcium 600 mg plus vitamin D 200 international units 2 tablets p.o. b.i.d.  5. Centrum Silver 1 tab p.o. daily.  6. Citalopram 40 mg 1 tab p.o. daily.  7. Fluocinonide 0.05% topical ointment apply to scalp daily as needed 8. Ipratropium bromide two sprays to each nostril b.i.d.  9. Lorazepam 0.5 mg 1 tablet p.o. every 5 hours as needed for anxiety.  10. Metoprolol succinate 25 mg extended release tablet 1 tab p.o. b.i.d.  11. Omeprazole 20 mg 1 capsule p.o. daily.  12. Pro-Air high flow inhaler 2 puffs inhaled 4 times a day.  13. Senokot 1 capsule p.o. b.i.d. with meals.  14. Spiriva 18 mcg 1 capsule inhaled daily.  15. Tramadol 50 mg 1 tablet p.o. every 8 hours as needed for pain.  16. Triamcinolone applied to affected area as needed for itching.  17. Vytorin 10 mg/20 mg tablet 1 tab p.o. daily.   ALLERGIES: ACE INHIBITORS AND PENICILLIN.   PERTINENT LABORATORY RESULTS AND RADIOGRAPHIC FINDINGS: Glucose is 138, BUN 20,  creatinine 0.77, sodium 135, potassium 3.4 troponin 0.08. White blood cell count 7.4. Hemoglobin is 12.1,  hematocrit 36.3. Urine is negative for infection. A chest x-ray shows hyperinflation, cardiomegaly and a T6 wedge compression fracture of indeterminate age.   PHYSICAL EXAMINATION:  VITAL SIGNS: Temperature 97.6, pulse 77, respirations 17, blood pressure 174/80, pulse oximetry 99% on 2 liters of oxygen via nasal cannula.  GENERAL: The patient is alert and oriented x 3 in no apparent distress.  HEENT: Normocephalic, atraumatic. Pupils equal, round, and reactive to light and accommodation. Extraocular movements are intact. Mucous membranes are moist.  NECK: Trachea is midline. No adenopathy.  CHEST: Symmetric and atraumatic.  CARDIOVASCULAR: Regular rate and rhythm. Normal S1, S2. No rubs, clicks, or murmurs appreciated.  LUNGS: Clear to auscultation bilaterally. Normal effort and excursion.  ABDOMEN: Positive bowel sounds. Soft, nontender, nondistended. No hepatosplenomegaly.  GENITOURINARY: Deferred.  MUSCULOSKELETAL: The patient moves all four extremities equally. Strength is 5/5 bilaterally. 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    SKIN: No rashes or lesions.  EXTREMITIES: No clubbing, cyanosis, or edema.  NEUROLOGIC: Cranial nerves II through XII are grossly intact.   ASSESSMENT AND PLAN: This is an 81 year old female with elevated troponin following an episode of chest pain.   1. Chest pain. The pain is atypical in its duration and there are no ischemic changes seen on EKG. The patient's elevated troponin is likely secondary to demand ischemia, however, a heparin drip we was ordered in the Emergency Department as a precaution. We will follow cardiac enzymes and obtain a cardiology consult at the discretion of the primary care team.  2. Chronic obstructive pulmonary disease. Continue albuterol and Advair.  3. Hypertension. Continue amlodipine and  metoprolol.  4. Deep vein thrombosis prophylaxis. Sequential compression devices. (Patient is currently on treatment dose of heparin).  5. Gastrointestinal prophylaxis. Pantoprazole.   CODE STATUS: The patient is a DO NOT RESUSCITATE. She is no code and has her advanced care orders on the chart.   TIME SPENT ON PATIENT CARE AND ADMISSION ORDERS: Approximately 35 minutes.     ____________________________ Kelton PillarMichael S. Sheryle Hailiamond, MD msd:JT D: 08/10/2014 01:58:50 ET T: 08/10/2014 02:13:28 ET JOB#: [id number]  cc: Kelton PillarMichael S. Sheryle Hailiamond, MD, <Dictator> Kelton PillarMICHAEL S Tyus Kallam MD ELECTRONICALLY SIGNED 08/10/2014 6:40

## 2015-03-17 NOTE — Discharge Summary (Signed)
PATIENT NAME:  [name], [name] MR#:  [id number] DATE OF BIRTH:  [birth year]  DATE OF ADMISSION:  05/20/2012 DATE OF DISCHARGE:  05/24/2012  PRIMARY CARE PHYSICIAN: [facility]   FINAL DIAGNOSES:  1. Dysphagia. I am not quite sure what this was, maybe an allergic reaction, possible anxiety, possible postnasal drip but it has gotten better.  2. Encephalopathy, which improved.  3. Malignant hypertension. Blood pressure very variable during hospital course.  4. Hypokalemia.  5. Chronic obstructive pulmonary disease.  6. Hyperlipidemia.  7. Arthritis.   MEDICATIONS ON DISCHARGE:  1. Vytorin 10/20, 1 tablet daily.  2. Calcium and vitamin D 2 tablets twice a day. 3. Centrum 1 tablet daily.  4. Aspirin 81 mg daily.  5. ProAir 2 puffs 4 times a day.  6. Spiriva 1 inhalation daily.  7. Triamcinolone cream to affected area as needed for itching. 8. Metoprolol tartrate 25 mg extended-release 1 tablet twice a day.  9. Advair Diskus 100/50, 1 inhalation daily.  10. Fluocinolone topical to affected area once a day as needed for itching. 11. Dose on tramadol has changed to 50 mg every eight hours as needed for pain. 12. Celexa increased to 40 mg daily.  13. Lorazepam 0.5 mg every six hours as needed for anxiety. 14. Amlodipine 0.5 mg daily.  15. Senokot 1 tablet twice a day.  16. New medication: Ipratropium nasal spray two sprays each nostril twice a day.  17. Omeprazole 20 mg daily.   ACTIVITY: As tolerated with physical therapy.   DIET: Low sodium diet.   FOLLOW UP: Follow up in 1 to 2 days with doctor at rehab.   REASON FOR ADMISSION: Patient was admitted 05/20/2012, discharged 05/24/2012. Came in with altered mental status and severe hypertension. Blood pressure went up to 240 systolic, brought to the Emergency Room confused. Altered mental status was initially thought to be secondary to hypertensive encephalopathy.   LABORATORY, DIAGNOSTIC AND RADIOLOGICAL DATA: Urinalysis  negative. Troponin negative. Glucose 116, BUN 13, creatinine 0.83, sodium 136, potassium 3.0, chloride 99, CO2 28, calcium 9.6. Liver function tests normal. White blood cell count 6.0, hemoglobin and hematocrit 14.9 and 43.8, platelet count 202. CT scan of the head showed no acute intracranial process. CT scan of the neck showed no lymphadenopathy or fluid collection in the neck. The next day potassium went up to 5.2, creatinine 1.25. Urinalysis upon discharge negative. White blood cell count 11.3, hemoglobin 12.1, platelet count 176, glucose 106, BUN 17, creatinine 0.76, sodium 137, potassium 4.0, chloride 101, CO2 28.   HOSPITAL COURSE PER PROBLEM LIST:  1. For the patient's dysphagia, that was her major complaint that she was unable to swallow. It was positional when she turned her head to the right things would cut off like she was having trouble breathing and trouble swallowing but when she turned her head to the left it was normal. It was unclear what the etiology of this was, whether it was an allergic reaction. I stopped the Cozaar and the fentanyl and gave some Decadron; whether this was anxiety ended up having to give her some IV Ativan; whether this was postnasal drip I started some ipratropium nasal spray. I did have Dr. Jenne CampusMcQueen from ENT who reviewed the CAT scan which was normal and did not feel like there was an ENT process. This dysphagia got better. She is tolerating a regular diet without issues. I just stopped the Cozaar and the fentanyl. Empiric PPI. No further work-up at this point  since improved.  2. Encephalopathy, improved. Whether this was medication related or hypertension related unsure, back to baseline mental status.  3. Malignant hypertension. Could have been secondary to the dysphagia, unable to take medications initially. She is back on her metoprolol and her dose of Norvasc increased to 5 mg. Blood pressure very labile here.  4. Hypokalemia. This was replaced during the hospital  stay, actually over replaced, and then normal upon discharge.  5. Chronic obstructive pulmonary disease. She is stable on her inhalers. Lungs sound good upon discharge.  6. Hyperlipidemia. She is on Vytorin.  7. Arthritis. Careful with pain medications in this patient, very sensitive; decreased the dose of tramadol.  8. For her anxiety, depression her Celexa was increased and p.r.n. Ativan.   TIME SPENT ON DISCHARGE: 40 minutes.   ____________________________ Herschell Dimes. Renae Gloss, MD rjw:cms D: 05/24/2012 09:43:08 ET T: 05/24/2012 10:05:55 ET JOB#: [id number]  cc: Herschell Dimes. Renae Gloss, MD, <Dictator> Dr. Meredith Leeds at [facility] MD ELECTRONICALLY SIGNED 05/26/2012 12:21

## 2015-03-17 NOTE — Consult Note (Signed)
Brief Consult Note: Diagnosis: dehydration.   Patient was seen by consultant.   Consult note dictated.   Orders entered.   Discussed with Attending MD.   Comments: Oral/laryngeal dehydration-no evidence of pharyngeal/laryngeal/upper esophageal mass on CT.  Electronic Signatures: Davina PokeMcqueen, Obed Samek T (MD)  (Signed 29-Jun-13 15:35)  Authored: Brief Consult Note   Last Updated: 29-Jun-13 15:35 by Davina PokeMcqueen, Anaiza Behrens T (MD)

## 2015-03-17 NOTE — H&P (Signed)
PATIENT NAME:  [name], [name] MR#:  [id number] DATE OF BIRTH:  12/08/1924  DATE OF ADMISSION:  05/20/2012  PRIMARY CARE PHYSICIAN: Dr. Meredith Leeds at [facility].   CHIEF COMPLAINT: Altered mental status and severe hypertension.   HISTORY OF PRESENT ILLNESS: [name] is an 81 year old Caucasian female with a history of systemic hypertension and osteoarthritis. The patient was in her usual state of health until today while she is resting at the assisted facility. She was noticed to have altered mental status and slightly confused. Her blood pressure was checked and was around 240 systolic. The patient was brought to the emergency department for evaluation and treatment. Along with her symptoms at one point she said she has some numbness in the right leg; however, she is not specifying that to me, and she is not even reporting that to me although I asked her if she has any numbness. She also denies having any focal weakness. Denies headache, no blurring of vision.   REVIEW OF SYSTEMS. CONSTITUTIONAL: Denies any fever. No chills. No night sweats. No fatigue. EYES: No blurring of vision. No double vision. ENT: No hearing impairment. No sore throat. No dysphagia. However, for months she feels that her throat is dry and as if there is sputum there. No epistaxis. CARDIOVASCULAR: Denies any chest pain. No shortness of breath. No syncope. RESPIRATORY: No shortness of breath. No cough. No sputum production. No hemoptysis. GASTROINTESTINAL: No abdominal pain. No nausea, no vomiting, no diarrhea. GENITOURINARY: No dysuria. No frequency of urination. MUSCULOSKELETAL: She has chronic right shoulder pain and restricted movement on this shoulder. No muscular pain. No muscular swelling. No joint swelling other than small joints of her hands, especially the right hand. They are swollen from chronic arthritis. INTEGUMENTARY: No skin rash. No ulcers. NEUROLOGY: No focal weakness. No seizure activity. No headache, no ataxia.  Earlier she said that she has some numbness in the right leg but she denies that to me. PSYCHIATRY: No anxiety. No depression currently but by history she has depression and anxiety. ENDOCRINE: No polyuria or polydipsia. No heat or cold intolerance.   PAST MEDICAL HISTORY:  1. Systemic hypertension.  2. Osteoarthritis maintained on analgesics and fentanyl patch. 3. Chronic obstructive pulmonary disease. 4. History of kidney stones.   PAST SURGICAL HISTORY: She had surgery for kidney stones, likely lithotripsy.   SOCIAL HABITS: Ex chronic smoker. She quit 10 years ago. She still drinks alcohol, primarily wine 1 to 2 glasses a day, sometimes beer.   SOCIAL HISTORY: She is widowed. Lives at assisted home facility. She indicates that she has a LIVING WILL, and she had appointed her oldest daughter, Judeth Cornfield, to have the power of attorney to make medical decisions.   FAMILY HISTORY: Her mother suffered from hypertension. She died from complications of stroke. Her father had diabetes mellitus.   ADMISSION MEDICATIONS:  1. Losartan or Cozaar 50 mg once a day. 2. Amlodipine 2.5 mg once a day. 3. Metoprolol 25 mg twice a day. 4. Ativan 1 mg once a day p.r.n. for anxiety.  5. Aspirin 81 mg a day. 6. Celexa 20 mg a day. 7. Calcium carbonate with vitamin D twice a day.  8. Vytorin 10/20 once a day.  9. Fentanyl patch use 12 mcg once every 3 days.  10. Centrum Silver 1 tablet once a day.  11. Senokot 3 times a day.  12. Tramadol 100 mg 4 times daily p.r.n. for pain. 13. Advair 50/100 one puff once a day. 14. Albuterol  Pro-Air HFA 2 puffs 4 times a day. 15. Spiriva 1 inhalation once a day.   ALLERGIES: Penicillin and ACE inhibitor. ACE inhibitor causes swelling.   PHYSICAL EXAMINATION:  VITAL SIGNS: Her blood pressure was 240 systolic, later blood pressure went down to 195/108, respiratory rate is 20, pulse 78, temperature 97.8, oxygen saturation 96%.   GENERAL APPEARANCE: Elderly female  lying in bed in no acute distress.   HEAD AND NECK EXAMINATION: No pallor. No icterus. No cyanosis.   EAR, NOSE, THROAT EXAMINATION: Hearing was normal. Nasal mucosa, lips, tongue were normal, except for slight dry mucous membranes.   EYE EXAMINATION: Revealed normal eyelids and conjunctivae. Pupils are about 8 mm, equal and reactive to light.   NECK: Supple. Trachea at midline. No thyromegaly. No cervical lymphadenopathy. No masses.   HEART: Normal S1, S2. No S3, S4. No murmur. No gallop. No carotid bruits.   RESPIRATORY: Normal breathing pattern without use of accessory muscles. No rales. No wheezing.   ABDOMEN: Soft without tenderness. No hepatosplenomegaly. No masses. No hernias.   SKIN: No ulcers. No subcutaneous nodules.   MUSCULOSKELETAL: No joint swelling other than the small joints of her hands, worse on the right hand than the left. There is decreased range of motion on right shoulder due to her advanced osteoarthritis. This is chronic for her.   NEUROLOGICAL EXAMINATION: Cranial nerves II through XII are intact. No focal motor deficit.   PSYCHIATRY EVALUATION: The patient is alert, oriented x3. Mood and affect were normal.   LABORATORY FINDINGS: CT scan of the head without contrast showed no acute intracranial abnormality. Chronic small vessel disease. EKG showed normal sinus rhythm at rate of 100 per minute, nonspecific ST abnormalities. Serum glucose was 116, BUN 13, creatinine 0.8, sodium 136, potassium was low at 3, calcium 9.6. Normal liver function tests. Troponin 0.03. Normal CBC with hemoglobin of 14 and hematocrit of 43. Urinalysis was unremarkable.   ASSESSMENT:  1. Altered mental status likely secondary to hypertensive encephalopathy.  2. Malignant hypertension resulting in hypertensive encephalopathy.  3. Hypokalemia.  4. Chronic obstructive pulmonary disease, stable.  5. Osteoarthritis.   PLAN: Admit to the telemetry unit. Frequent neurologic examination and  followup. Blood pressure monitoring and control. I will increase her amlodipine to 10 mg a day and increase her metoprolol to 50 mg twice a day. If systolic blood pressure is more than 170 the patient will receive intravenous labetalol p.r.n. I will increase her aspirin dose from 81 mg to 325 mg a day. Her mental status appears to be improving; however, if there is further change or a decline then we may consider doing MRI of the brain. I will continue the rest of her home medications. Again, the patient reports that she has a LIVING WILL. She had appointed her oldest daughter, Judeth CornfieldStephanie, to have the power of attorney to help make medical decisions.   TIME SPENT EVALUATING THIS PATIENT: More than one hour.    ____________________________ Carney CornersAmir M. Rudene Rearwish, MD amd:vtd D: 05/20/2012 23:57:26 ET T: 05/21/2012 07:20:14 ET JOB#: [id number]  cc: Carney CornersAmir M. Rudene Rearwish, MD, <Dictator> Meredith LeedsMichael Meredith, MD, at [facility] Dala DockM Tally Mckinnon MD ELECTRONICALLY SIGNED 05/22/2012 6:44

## 2015-03-17 NOTE — Consult Note (Signed)
PATIENT NAME:  Lauren StakesWOOD, [name] MR#:  [id number] DATE OF BIRTH:  05-29-1925  DATE OF CONSULTATION:  05/21/2012  REFERRING PHYSICIAN:  Alford Highlandichard Wieting, MD CONSULTING PHYSICIAN:  Davina Pokehapman T. Sarahann Horrell, MD  REASON FOR CONSULTATION: Dysphagia.   HISTORY OF PRESENT ILLNESS: This is an 81 year old female who was admitted on 05/20/2012 from her assisted-living facility for severe hypertension. Her blood pressure was noted to be systolic of 240. She was admitted through the Emergency Room for this. During her admission, she had complained of some dysphagia, particularly to solids, and when she leans forward she feels like there is something blocking over the back of her tongue. She does have severe history of osteoarthritis and is on multiple medications for this including fentanyl patch. Her daughter tells me that she took 8 tramadol yesterday for her pain. Today her swallowing seems to be a little bit better. Still when she leans forward she feels like there is something at the back of her tongue. She has no stridor. She has no abnormality of her voice. She has no hemoptysis. She really has no odynophagia.   PAST MEDICAL HISTORY:  1. Systemic hypertension. 2. Osteoarthritis. 3. Chronic obstructive pulmonary disease. 4. Emphysema. 5. History of kidney stones.  PAST SURGICAL HISTORY: Surgery for kidney stones.   SOCIAL HABITS: She is an ex-smoker and has an occasional glass of wine.   SOCIAL HISTORY: She is widowed and lives in assisted living.   FAMILY HISTORY: Significant for hypertension.  ADMISSION MEDICATIONS: Multiple and listed in the chart including either losartan or Cozaar.  DRUG ALLERGIES: Penicillin and ACE inhibitors. She has had ACE inhibitors in the past which cause swelling.  PAST MEDICAL HISTORY: Otherwise unremarkable.   PHYSICAL EXAMINATION:   EARS/NOSE: The external ears appeared normal. The anterior nose is patent. She does have nasal cannula in. She has very dry mucous  membranes.   ORAL CAVITY AND OROPHARYNX: The floor of mouth is clear. The tongue is normal. Mucous membranes are extremely dry. Her uvula was normal size and was actually stuck to the posterior pharynx. She had no trismus. She had a normal voice.   NECK: On palpation of her neck, there was no adenopathy palpated. There was no evidence of edema or swelling. The submandibular areas were benign.  DATA: I reviewed her CT scan in detail. She had a normal tongue base, normal epiglottis, normal pyriform sinus areas, normal glottic area, normal posterior glottic area, normal-appearing esophagus, and the trachea and thyroid gland all appeared normal. I also showed her daughter this on syngo.     IMPRESSION AND RECOMMENDATIONS: Dysphagia with severe dry mucous membranes. I suspect the biggest part of her issue at this point is dehydration with severe dry mucous membranes. I have spoken with Dr. Renae GlossWieting about this. We will initiate humidifier for the room, nasal saline irrigations, humidifier her oxygen which is via nasal cannula, and recommend ice chips for her and a clear liquid diet. I did not perform a laryngoscopy today as she had a normal voice and her CT scan was normal. There was no evidence of obstructing mass. I have instructed the family and Dr. Renae GlossWieting should her symptoms worsen to let me know. Otherwise, I am happy to see her as an outpatient.  ____________________________ Davina Pokehapman T. Lavine Hargrove, MD ctm:slb D: 05/21/2012 15:39:48 ET     T: 05/21/2012 15:54:37 ET        JOB#: [id number] Sibyl ParrHAPMAN T Isaiyah Feldhaus MD ELECTRONICALLY SIGNED 06/07/2012 7:13

## 2015-03-18 LAB — SURGICAL PATHOLOGY

## 2015-03-24 ENCOUNTER — Encounter
Admission: RE | Admit: 2015-03-24 | Discharge: 2015-03-24 | Disposition: A | Discharge status: Home / Self Care | Payer: Medicare Other | Source: Ambulatory Visit | Attending: Internal Medicine | Admitting: Internal Medicine

## 2015-03-24 DIAGNOSIS — F329 Major depressive disorder, single episode, unspecified: Secondary | ICD-10-CM | POA: Insufficient documentation

## 2015-03-24 NOTE — Discharge Summary (Signed)
PATIENT NAME:  [name], [name] MR#:  [id number] DATE OF BIRTH:  25-Nov-1924  DATE OF ADMISSION:  12/02/2014 DATE OF DISCHARGE:  12/08/2014  DISCHARGE DIAGNOSES: 1. Acute ischemic colitis.  2. Chronic respiratory failure.  3. Chronic obstructive pulmonary disease on oxygen.  4. Hyperlipidemia.  5. Hypertension.  6. Osteoporosis.   DISCHARGE MEDICATIONS: Aspirin 81 mg daily, amlodipine 5 mg daily, metoprolol tartrate 25 mg Farrell.i.d., Spiriva 18 mcg daily, citalopram 20 mg daily, calcium with vitamin D Farrell.i.d., Zetia 10/20 daily, trazodone 50 mg at bedtime, Advair 100/50 Farrell.i.d., ProAir 2 puffs q.i.d., ipratropium nasal spray 2 sprays each nostril Farrell.i.d., Imdur 30 mg q.a.m., metronidazole 250 mg t.i.d., Ceftin 250 mg Farrell.i.d.   REASON FOR ADMISSION: This 81 year old female presents with abdominal pain. Please see H and P for HPI, past medical history, and physical exam.   HOSPITAL COURSE: The patient was admitted. CT scan showed possible ischemia. She was treated with IV antibiotics. Her diarrhea improved. Biopsy did show ischemia. Selected angiogram per vascular of the SMA and IMA showed no significant stenosis. She will be going home on oral antibiotics, with home health for physical therapy for weakness, and [facility] [place], where she has VIP services. Will follow up with Dr. Dareen PianoAnderson in 2 weeks    ____________________________ Danella PentonMark F. Desani Sprung, MD mfm:mw D: 12/08/2014 09:21:05 ET T: 12/08/2014 16:39:21 ET JOB#: [id number]  cc: Danella PentonMark F. Cherylanne Ardelean, MD, <Dictator> Harith Mccadden Sherlene ShamsF Adriel Desrosier MD ELECTRONICALLY SIGNED 12/09/2014 10:25

## 2015-03-24 NOTE — Consult Note (Signed)
Chief Complaint:  Subjective/Chief Complaint Please see full GI consult and brief consult note.  Chart reviewed.  Patietn presenting with acute abdominal apin followed with bloody diarrhea.  CT choed marked colon wall thickening in left colon, possible consistant eith colonic idchemia.  Patietn has had no food aversion, but alot of abdominal bloatign after eating in the past 2 weeks.  I was unable to elicit typical sx of mesenteric ischemia, other than that on presentation on this admission.  I will need to discuss with patient and family, but would recommend to do flexible sigmoidoscopy with minimal sedation.  If consistant with mesenteric ischemia recommend following with angio.   VITAL SIGNS/ANCILLARY NOTES: **Vital Signs.:   11-Jan-16 13:04  Vital Signs Type Routine  Temperature Temperature (F) 98.5  Celsius 36.9  Temperature Source oral  Respirations Respirations 20  Systolic BP Systolic BP 128  Diastolic BP (mmHg) Diastolic BP (mmHg) 61  Mean BP 83  Pulse Ox % Pulse Ox % 98  Pulse Ox Activity Level  At rest  Oxygen Delivery 3L   Electronic Signatures: Barnetta ChapelSkulskie, Welby Montminy (MD)  (Signed 11-Jan-16 19:43)  Authored: Chief Complaint, VITAL SIGNS/ANCILLARY NOTES   Last Updated: 11-Jan-16 19:43 by Barnetta ChapelSkulskie, Neysa Arts (MD)

## 2015-03-24 NOTE — Consult Note (Signed)
Chief Complaint:  Subjective/Chief Complaint seen for abdominal pain and rectal bleeding.  results of mesenteric angio noted.    tolerating po clears,  no n/v, some mild abdominal discomfort, mostly left lower quadrant.   VITAL SIGNS/ANCILLARY NOTES: **Vital Signs.:   14-Jan-16 13:25  Vital Signs Type Routine  Celsius 36.5  Temperature Source oral  Pulse Pulse 102  Respirations Respirations 20  Systolic BP Systolic BP 431  Diastolic BP (mmHg) Diastolic BP (mmHg) 76  Mean BP 103  Pulse Ox % Pulse Ox % 99  Pulse Ox Activity Level  At rest  Oxygen Delivery 3L  *Intake and Output.:   14-Jan-16 07:57  Stool  small loose    08:22  Stool  small loose   Brief Assessment:  Cardiac Regular   Respiratory clear BS   Gastrointestinal details normal Soft  Nondistended  Bowel sounds normal  No rebound tenderness  mild tenderness to palpation mostly llq, improving.   Lab Results: Pathology:  13-Jan-16 00:00   Pathology Report CASE: [id number] PATIENT: [facility] Surgical Pathology Report      SPECIMEN SUBMITTED: A. Colon, distal sigmoid 30cm, cbx B. Rectum, cbx   CLINICAL HISTORY: None provided   PRE-OPERATIVE DIAGNOSIS: Abd pain, colitis rectal bleed   POST-OPERATIVE DIAGNOSIS: Colitis- undetermined etiology      DIAGNOSIS: A. COLON, DISTAL SIGMOID 30 CM; COLD BIOPSY: - COLONIC MUCOSA WITH CHANGES CONSISTENT WITH CHRONIC ISCHEMIA. - NEGATIVE FOR ACTIVE INFLAMMATION, DYSPLASIA, AND MALIGNANCY.  B. RECTUM; COLD BIOPSY: - COLONIC MUCOSA WITH CHANGES CONSISTENT WITH CHRONIC ISCHEMIA. - NEGATIVE FOR ACTIVE INFLAMMATION, DYSPLASIA, AND MALIGNANCY.  Comment: These results were communicated to Dr. Gustavo Lah on 12/06/2014 at 8:50 AM.    GROSS DESCRIPTION: A. Labeled: Cold biopsy distal sigmoid 30 cm Tissue Fragment(s): 2 Measurement: 0.2-0.3 cm Comment: Pink red tissue fragments  Entirely submitted in cassette(s): 1  B. Labeled: Cold biopsy rectum Tissue  Fragment(s): 3 Measurement: 0.2-0.4 cm Comment: Tan tissue fragments  Entirely submitted in cassette(s): 1       Final Diagnosis performed by Quay Burow, MD.  Electronically signed 12/06/2014 8:51:13AM    The electronic signature indicates that the named Attending Pathologist has evaluated the specimen  Technical component performed at [facility], [address] [place], [place] [address] Lab: [phone] Dir: Darrick Penna. Evette Doffing, MD  Professional component performed at [facility], [facility], [place], [place], [place] [address] Lab: [phone] Dir: Dellia Nims. Rubinas, MD   Routine Chem:  14-Jan-16 05:01   Magnesium, Serum  1.3 (1.8-2.4 THERAPEUTIC RANGE: 4-7 mg/dL TOXIC: > 10 mg/dL  -----------------------)  Glucose, Serum  127  BUN  4  Creatinine (comp) 0.73  Sodium, Serum 140  Potassium, Serum  3.4  Chloride, Serum  108  CO2, Serum 27  Calcium (Total), Serum  7.7  Anion Gap  5  Osmolality (calc) 278  eGFR (African American) >60  eGFR (Non-African American) >60 (eGFR values <48m/min/1.73 m2 may be an indication of chronic kidney disease (CKD). Calculated eGFR, using the MRDR Study equation, is useful in  patients with stable renal function. The eGFR calculation will not be reliable in acutely ill patients when serum creatinine is changing rapidly. It is not useful in patients on dialysis. The eGFR calculation may not be applicable to patients at the low and high extremes of body sizes, pregnant women, and vegetarians.)  Routine Hem:  10-Jan-16 12:01   WBC (CBC)  14.3  Hemoglobin (CBC) 13.9  11-Jan-16 06:01   WBC (CBC)  12.5  Hemoglobin (  CBC)  11.2    18:18   Hemoglobin (CBC)  10.9 (Result(s) reported on 03 Dec 2014 at 06:34PM.)  12-Jan-16 04:46   WBC (CBC)  12.7  Hemoglobin (CBC)  11.2  13-Jan-16 05:26   WBC (CBC) 10.4  Hemoglobin (CBC)  10.9  14-Jan-16 05:01   WBC (CBC) 6.6  RBC (CBC)  3.17  Hemoglobin (CBC)  10.1   Hematocrit (CBC)  30.4  Platelet Count (CBC) 178  MCV 96  MCH 31.7  MCHC 33.2  RDW 13.0  Neutrophil % 72.8  Lymphocyte % 12.6  Monocyte % 10.5  Eosinophil % 3.6  Basophil % 0.5  Neutrophil # 4.8  Lymphocyte #  0.8  Monocyte # 0.7  Eosinophil # 0.2  Basophil # 0.0 (Result(s) reported on 06 Dec 2014 at 06:04AM.)   Assessment/Plan:  Assessment/Plan:  Assessment 1) abdominal pain, rectal bleeding-presentation and biopsies consistant with ischemia, likely small vessel disease in the setting of known large vessel disease. improving.   Plan 1) finish 7 day course of abx, continue asa.  adequate outpatient hydration.  Will advance diet, would do low residue for 7-10 days.  Miralax for constipation if needed. O/P fu with GI/PMD as needed.   Electronic Signatures: Loistine Simas (MD)  (Signed 14-Jan-16 18:04)  Authored: Chief Complaint, VITAL SIGNS/ANCILLARY NOTES, Brief Assessment, Lab Results, Assessment/Plan   Last Updated: 14-Jan-16 18:04 by Loistine Simas (MD)

## 2015-03-24 NOTE — H&P (Signed)
PATIENT NAME:  [name], [name] MR#:  [id number] DATE OF BIRTH:  05-19-1925  DATE OF ADMISSION:  12/02/2014  REFERRING PHYSICIAN: Dr. Dondra Spry.   FAMILY PHYSICIAN: Dr. Dareen Piano.   REASON FOR ADMISSION: Acute abdominal pain.   HISTORY OF PRESENT ILLNESS: The patient is an 81 year old female who resides at the [facility] with a significant history of chronic respiratory failure and COPD on 3 liters of oxygen normally. Also has a history of benign hypertension, hyperlipidemia, and osteoporosis. Presents to Emergency Room with acute abdominal pain associated with nausea, vomiting and diarrhea. In the Emergency Room, the patient was noted to have a white count of 14.3. CT of the abdomen revealed diffuse colitis. She denies any history of previous colitis. Has had some blood in her bowels. She is now admitted for further evaluation.   PAST MEDICAL HISTORY: 1. Chronic respiratory failure.  2. COPD, on oxygen.  3. Hyperlipidemia.  4. Benign hypertension.  5. Osteoarthritis.  6. Osteoporosis.  7. History of nephrolithiasis.   MEDICATIONS: 1. Trazodone 50 mg p.o. at bedtime.  2. Spiriva 1 capsule inhaled daily.  3.    Albuterol  2 puffs q.i.d.  4. Lopressor 25 mg p.o. b.i.d.  5. Atrovent nasal spray 2 puffs each nostril b.i.d.  6. Lasix 20 mg p.o. Monday, Wednesday, Friday.  7. Advair 100/50 one puff b.i.d.  8. Zetia 10 mg p.o. daily.  9. Simvastatin 20 mg p.o. daily.  10. Citalopram 20 mg p.o. daily.  11. Multivitamin 1 p.o. daily.  12. Aspirin 81 mg p.o. daily.  13. Amlodipine 5 mg p.o. daily.   ALLERGIES: ACE INHIBITORS AND PENICILLIN.   SOCIAL HISTORY: Negative for alcohol or tobacco abuse.   FAMILY HISTORY: Positive for hypertension and coronary artery disease.   REVIEW OF SYSTEMS: CONSTITUTIONAL: No fever or change in weight.  EYES: No blurred or double vision. No glaucoma.  EARS, NOSE, THROAT: No tinnitus or hearing loss. No nasal discharge or bleeding. No difficulty  swallowing.  RESPIRATORY: No cough or wheezing. Denies hemoptysis.  CARDIOVASCULAR: No chest pain or palpitations. No orthopnea or syncope.  GASTROINTESTINAL: As per history of present illness.  GENITOURINARY: No dysuria or hematuria. No incontinence.  ENDOCRINE: No polyuria or polydipsia, or cold intolerance.  HEMATOLOGIC: The patient denies anemia, easy bruising, or bleeding.  LYMPHATIC: No swollen glands.  MUSCULOSKELETAL: The patient has pain in her neck, back, shoulders, knees, and hips. No gout.  NEUROLOGIC: No numbness or migraines. Denies stroke or seizures.  PSYCHOLOGICAL: The patient denies anxiety, insomnia, or depression.   PHYSICAL EXAMINATION: GENERAL: The patient is in no acute distress.  VITAL SIGNS: Currently remarkable for a blood pressure of 169/70 with a heart rate of 80, respiratory rate of 18, temperature 97.6 and a saturation of 100% on oxygen.  HEENT: Normocephalic, atraumatic. Pupils equally round and reactive to light and accommodation. Extraocular movements are intact. Sclerae are anicteric. Conjunctivae are clear. Oropharynx clear.  NECK: Supple without JVD. No thyromagaly or adenopathy  was noted.  LUNGS: Revealed decreased breath sounds with basilar rhonchi. No wheezes or rales. No dullness. Respiratory effort is normal.  CARDIAC: Regular rate and rhythm. Normal S1, S2. No significant rubs or gallops. PMI is nondisplaced. Chest wall is nontender.  ABDOMEN: Soft with some tenderness in the lower quadrants bilaterally. No rebound or guarding. Normoactive bowel sounds. No organomegaly or masses were appreciated. No hernias or bruits were noted.  EXTREMITIES: Without clubbing, cyanosis or edema. Pulses were 2+ bilaterally.  SKIN: Warm and dry  without rash or lesions.  NEUROLOGIC: Cranial nerves II through XII grossly intact. Deep tendon reflexes were symmetric. Motor and sensory examination is nonfocal.  PSYCHIATRIC: Revealed a patient who is alert and oriented to  person, place, and time. She was cooperative and used good judgment.   LABORATORY DATA: EKG revealed sinus rhythm with no acute ischemic changes. CT of the abdomen revealed diffuse colitis extending from the distal transverse colon to the rectum. Extensive atherosclerosis was also noted.   Her white count was 14.3 with a hemoglobin of 13.9. Glucose 238 with a BUN of 19, creatinine of 0.97 and a sodium of 132. Troponin was 0.04.   ASSESSMENT: 1. Acute abdominal pain.  2. Colitis by CT.  3. Nausea and vomiting.  4. Diarrhea.  5. Hyponatremia.  6. Hyperglycemia.  7. Chronic respiratory failure, on oxygen.   PLAN: The patient will be admitted to the floor as a do not resuscitate per her outpatient documentation. Should be maintained on oxygen with her usual pulmonary medications. We will begin IV fluids and IV antibiotics. Will consult GI as well as vascular given the fact that this could be ischemic colitis. Will use morphine and Zofran as needed for pain and nausea. We will obtain baseline chest x-ray. Follow her oxygenation closely. Follow her sugars with Accu-Cheks before meals and at bedtime and add sliding scale insulin as needed. Follow up routine labs in the morning. Further treatment and evaluation will depend upon the patient's progress.   TOTAL TIME SPENT ON THIS PATIENT: 50 minutes.    ____________________________ Duane LopeJeffrey D. Judithann SheenSparks, MD jds:ST D: 12/02/2014 16:21:49 ET T: 12/02/2014 16:38:24 ET JOB#: [id number]  cc: Duane LopeJeffrey D. Judithann SheenSparks, MD, <Dictator> Marya AmslerMarshall W. Dareen PianoAnderson, MD Sharese Manrique Rodena Medin [place] Echavarria MD ELECTRONICALLY SIGNED 12/02/2014 18:04

## 2015-03-24 NOTE — Consult Note (Signed)
PATIENT NAME:  [name], [name] MR#:  [id number] DATE OF BIRTH:  [birth year]  DATE OF CONSULTATION:  12/03/2014  REFERRING PHYSICIAN: Duane LopeJeffrey D. Judithann SheenSparks, MD   CONSULTING PHYSICIAN:  Keturah Barrehristiane H. Angala Hilgers, NP  REASON FOR CONSULTATION: GI consult ordered by Dr. Judithann SheenSparks for evaluation of abdominal pain with colitis.   HISTORY OF PRESENT ILLNESS: I appreciate consult for 81 year old Caucasian woman with history of CRPD/CRS/O2 dependence who was admitted with acute abdominal pain for evaluation of same and possible colitis on CT. The patient reports sudden onset of mid central abdominal pain that traveled down the left side of her abdomen either yesterday or the day prior, associated with bloody diarrhea. States as frequently as every 10 minutes in the beginning, then improved. Passed 1 tiny red movement today, described by her nurse as looking like a red clot. States she is no longer having any abdominal pain. Seems to have been resolved overnight. Denies further GI complaints. Does report that she takes about 3 Aleve daily and has been taking some senna but cannot state how often. Last colonoscopy many, many years ago. CT of abdomen with the following findings: Diffuse colonic wall thickening from the mid transverse colon to the rectum, significant plaques to the celiac/SMA, IMA patent, changes of chronic pancreatitis. 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 Currently on PPI, Cipro, Flagyl, afebrile and hemodynamically stable.   PAST MEDICAL HISTORY: Chronic respiratory failure, COPD on O2 therapy, hyperlipidemia, hypertension, osteoporosis, osteoarthritis, kidney stones.   MEDICATIONS: Trazodone 50 mg p.o. at bedtime, Spiriva 1 capsule inhaled daily, albuterol 2 puffs q.i.d., Lopressor 25 mg p.o. Farrell.i.d., Atrovent 2 puffs each nostril Farrell.i.d. Lasix 20 mg Monday, Wednesday, Friday. Advair 100/50 one puff Farrell.i.d., Zetia 10 mg p.o. daily,  simvastatin 20 mg p.o. daily, citalopram 20 mg p.o. daily, multivitamin 1 mg p.o. daily, ASA 81 mg once a day, amlodipine 5 mg once a day.   ALLERGIES: ACE INHIBITORS AND PENICILLIN.   [place]IAL H[place]ORY[place] Negative for alcohol, tobacco, illicits.   FAMILY HISTORY: Significant for hypertension and coronary artery disease.   REVIEW OF SYSTEMS: Ten systems reviewed, unremarkable other than mild fatigue, chronic shortness of breath described as stable and arthritic pain to hands, otherwise unremarkable.   LABORATORY DATA: Most recent laboratories: Glucose 109, BUN 11, creatinine 0.75, sodium 136. Potassium 3, this was replaced today. GFR greater than 60, calcium 7.9. Lipase 222, total protein 5.7, albumin 3.1, total bilirubin 0.7, ALP 40, AST 32, ALT 20. WBC 12.5, hemoglobin 11.2, hematocrit 34.4, platelet count 167,000. CT as described above.   PHYSICAL EXAMINATION:  VITAL SIGNS: Most recent: Temperature 98.7, pulse 84, respiratory rate 21, blood pressure 169/81, SaO2 99% on room air.  GENERAL: Pleasant elderly woman lying in bed in no acute distress.  HEENT: Normocephalic, atraumatic. Sclerae anicteric. Conjunctivae pink.  NECK: Supple. No JVD, thyromegaly or adenopathy. RESPIRATIONS: Minimally shallow, eupneic effort, clear to auscultation bilaterally.  CARDIAC: S1, S2, RRR. No MRG. No appreciable edema.  ABDOMEN: Soft, somewhat protuberant. Bowel sounds x 4, nondistended, nontender. No guarding, rigidity, peritoneal signs, hepatosplenomegaly, masses.  EXTREMITIES: MAEW x 4. No clubbing, cyanosis. Strength 5/5. Sensation appears to be intact.  SKIN: Warm, dry, pink without erythema, lesion or rash.  NEUROLOGICAL: Alert, oriented x 3. Cranial nerves II-XII intact.  PSYCHIATRIC: Pleasant, calm, cooperative. Good memory skills, insight and judgment.   IMPRESSION AND PLAN: Acute abdominal pain, history of bloody  diarrhea, both of these improved. It does sound like  ischemic-related colitis with her recent CT. Agree with antibiotics current. Anticipating vascular consult. We will discuss further with Dr. Marva Panda for further recommendations.   These services were provided by Vevelyn Pat, MSN, [facility], in collaboration with Barnetta Chapel, MD with whom I have discussed this patient in full.    ____________________________ Keturah Barre, NP chl:TT D: 12/03/2014 17:46:11 ET T: 12/03/2014 18:13:16 ET JOB#: [id number]  cc: Keturah Barre, NP, <Dictator> Eustaquio Maize Addilyne Backs FNP ELECTRONICALLY SIGNED 01/07/2015 15:14

## 2015-03-24 NOTE — Consult Note (Signed)
Chief Complaint:  Subjective/Chief Complaint seen prior to flex sig.  no nausea, no bleeding overnight, continues with abdominal discomfort, not as much as yesterday. tolerated enemas for proceedure.   VITAL SIGNS/ANCILLARY NOTES: **Vital Signs.:   13-Jan-16 05:02  Vital Signs Type Routine  Temperature Temperature (F) 97.8  Celsius 36.5  Temperature Source oral  Pulse Pulse 87  Respirations Respirations 20  Systolic BP Systolic BP 471  Diastolic BP (mmHg) Diastolic BP (mmHg) 73  Mean BP 100  Pulse Ox % Pulse Ox % 97  Pulse Ox Activity Level  At rest  Oxygen Delivery 3L  *Intake and Output.:   13-Jan-16 05:51  Stool  large loose watery    05:56  Stool  medium loose stool    06:07  Stool  small loose brown stool   Brief Assessment:  Cardiac Regular   Respiratory clear BS   Gastrointestinal details normal Soft  Nondistended  Bowel sounds normal  No rebound tenderness  mild  tenderness to palpation mostly in the llq,  though also minimal generalized.   Lab Results: Routine Chem:  13-Jan-16 05:26   Glucose, Serum  100  BUN  6  Creatinine (comp) 0.84  Sodium, Serum 138  Potassium, Serum  3.3  Chloride, Serum 106  CO2, Serum 26  Calcium (Total), Serum  7.9  Anion Gap  6  Osmolality (calc) 273  eGFR (African American) >60  eGFR (Non-African American) >60 (eGFR values <71m/min/1.73 m2 may be an indication of chronic kidney disease (CKD). Calculated eGFR, using the MRDR Study equation, is useful in  patients with stable renal function. The eGFR calculation will not be reliable in acutely ill patients when serum creatinine is changing rapidly. It is not useful in patients on dialysis. The eGFR calculation may not be applicable to patients at the low and high extremes of body sizes, pregnant women, and vegetarians.)  Routine Hem:  13-Jan-16 05:26   WBC (CBC) 10.4  RBC (CBC)  3.41  Hemoglobin (CBC)  10.9  Hematocrit (CBC)  32.8  Platelet Count (CBC) 175  MCV 96   MCH 31.9  MCHC 33.1  RDW 12.8  Neutrophil % 79.2  Lymphocyte % 9.6  Monocyte % 8.8  Eosinophil % 1.9  Basophil % 0.5  Neutrophil #  8.3  Lymphocyte # 1.0  Monocyte # 0.9  Eosinophil # 0.2  Basophil # 0.0 (Result(s) reported on 05 Dec 2014 at 06:09AM.)   Assessment/Plan:  Assessment/Plan:  Assessment 1) abdominal pain and rectal bleeding- improving.  2) marked atherosclerosis on abd CT, colitis from rectum proximal to transverse.  Concern for ischemic colitis, DDx including infective.   Plan 1) on flex sig this am  scope was passed to about 30 cm.  mild mucosal irritation noten on one side of colon at that point.  Sharp turns precluding further advance.  biopsies taken.  discussed with pathology.  No active luminal bleeding noted.  Biopsy results should be back tomorrow.   Electronic Signatures: SLoistine Simas(MD)  (Signed 13-Jan-16 08:18)  Authored: Chief Complaint, VITAL SIGNS/ANCILLARY NOTES, Brief Assessment, Lab Results, Assessment/Plan   Last Updated: 13-Jan-16 08:18 by SLoistine Simas(MD)

## 2015-03-24 NOTE — Consult Note (Signed)
brief note here, full consult note to followlikely ischemicreviewed.  Lots of atherosclerotic disease but 5 mm cuts and not CTA.benefit from mesenteric angiogram for further evaluation and possibly treatment.  If desired by GI and primary service, will schedule for Wednesday  Electronic Signatures: Annice Needyew, Carianna Lague S (MD)  (Signed on 11-Jan-16 15:04)  Authored  Last Updated: 11-Jan-16 15:04 by Annice Needyew, Kimon Loewen S (MD)

## 2015-03-24 NOTE — Consult Note (Signed)
CHIEF COMPLAINT and HISTORY:  Subjective/Chief Complaint abdominal pain, bloody BMs   History of Present Illness Patient admitted yesterday with acute onset of abdominal pain and bloody diarrhea.  She reports having had regulat colonoscopies up until she aged out of them a few years ago.  No previous history of post-prandial abdominal pain, unintentional weight loss, or food fear.  Had been eating fine up until this episode.  she has been passing bloody diarrhea type stools for past 24 hours as well.  Pain is somewhat generalized, maybe a little more in the left abdomen.  Pain better now than it was on admission.  Had a CT scan with contrast which I have indepently reviewed.  This was with 5 mm cuts and evaluation of the vasculature was pretty limited, but there is pretty extensive atherosclerosis of the aorta and branch vessels.  Report is made of possible celiac stenosis, and that may be present but this should not really affect the colon.  SMA and IMA pretty difficult to determine, but SMA does appear patent.  IMA is pretty small.  Official report says this appears to be patent, but I think this is pretty difficult to say for sure.   PAST MEDICAL/SURGICAL HISTORY:  Past Medical History:   copd:    High Cholesterol:    Osteoporosis:    arthritis:    Emphysema:    Kidney Stones:    Hypertension:    Lithotripsy:   ALLERGIES:  Allergies:  Ace inhibitors: Swelling  Penicillin: Unknown  HOME MEDICATIONS:  Home Medications: Medication Instructions Status  aspirin 81 mg oral tablet 1 tab(s) orally once a day Active  metoprolol tartrate 25 mg oral tablet 1 tab(s) orally 2 times a day Active  tiotropium 18 mcg inhalation capsule 1 cap(s) via handihaler once a day Active  citalopram 20 mg oral tablet 1 tab(s) orally once a day (in the morning) Active  Calcium 600+D 600 mg-200 intl units oral tablet 1 tab(s) orally 2 times a day Active  ezetimibe-simvastatin 10 mg-20 mg oral tablet 1  tab(s) orally once a day (at bedtime) Active  Centrum Silver Therapeutic Multiple Vitamins with Minerals oral tablet 1 tab(s) orally once a day Active  Senokot 1 tab(s) orally 3 times a day Active  traZODone 100 mg oral tablet 0.5 tab (28m) orally once a day (at bedtime) Active  fluticasone-salmeterol 100 mcg-50 mcg inhalation powder 1 puff(s) inhaled 2 times a day Active  ProAir HFA CFC free 90 mcg/inh inhalation aerosol 2 puff(s) inhaled 4 times a day Active  ipratropium nasal 42 mcg/inh nasal spray 2 spray(s) into each nostril 2 times a day prn Active  St. John's wort - oral tablet 1 tab(s) orally once a day, As Needed for mood. Active  Imdur 30 mg oral tablet, extended release 1 tab(s) orally once a day (in the morning) Active  amlodipine 5 mg oral tablet 1 tab(s) orally once a day Active   Family and Social History:  Family History Non-Contributory   Social History negative tobacco, negative ETOH, negative Illicit drugs   Place of Living Home   Review of Systems:  Subjective/Chief Complaint No TIA/stroke/seizure No heat or cold intolerance No dysuria/hematuria No blurry or double vision No tinnitus or ear pain No rashes or ulcer Positive for BRBPR   Fever/Chills No   Cough No   Sputum No   Abdominal Pain Yes   Diarrhea Yes   Constipation No   Nausea/Vomiting Yes   SOB/DOE No   Chest Pain  No   Telemetry Reviewed NSR   Dysuria No   Tolerating PT Yes   Tolerating Diet No   Medications/Allergies Reviewed Medications/Allergies reviewed   Physical Exam:  GEN well developed, well nourished, no acute distress, appears younger than stated age   HEENT pink conjunctivae, moist oral mucosa   NECK No masses  trachea midline   RESP normal resp effort  no use of accessory muscles   CARD regular rate  no JVD   VASCULAR ACCESS none   ABD soft  hypoactive BS   GU superpubic tenderness   LYMPH negative neck, negative axillae   EXTR negative  cyanosis/clubbing, negative edema   SKIN normal to palpation, skin turgor good   NEURO cranial nerves intact, follows commands, motor/sensory function intact   PSYCH alert, A+O to time, place, person, good insight   LABS:  Laboratory Results: Hepatic:    10-Jan-16 12:01, Comprehensive Metabolic Panel  Bilirubin, Total 0.7  Alkaline Phosphatase 60  46-116  NOTE: New Reference Range  06/12/14  SGPT (ALT) 34  14-63  NOTE: New Reference Range  06/12/14  SGOT (AST) 61  Total Protein, Serum 7.8  Albumin, Serum 4.4    11-Jan-16 06:01, Comprehensive Metabolic Panel  Bilirubin, Total 0.7  Alkaline Phosphatase 40  46-116  NOTE: New Reference Range  06/12/14  SGPT (ALT) 20  14-63  NOTE: New Reference Range  06/12/14  SGOT (AST) 32  Total Protein, Serum 5.7  Albumin, Serum 3.1  Lab:    11-Jan-16 04:00, Pulse Oximetry  O2 Saturation (Pulse Ox) 88  FiO2 (Pulse Ox) RA  Result(s) reported on 03 Dec 2014 at 05:32AM.  Cardiology:    10-Jan-16 11:45, ECG  Ventricular Rate 81  Atrial Rate 81  P-R Interval 176  QRS Duration 82  QT 384  QTc 446  P Axis 76  R Axis 52  T Axis 64  ECG interpretation   Normal sinus rhythm  Possible Anterior infarct (cited on or before 01-Oct-2014)  Abnormal ECG  When compared with ECG of 01-Oct-2014 09:41,  No significant change was found  ----------unconfirmed----------  Confirmed by OVERREAD, NOT ([id number]), editor PEARSON, BARBARA ([id number]) on 12/03/2014 1:41:18 PM  ECG   Routine Chem:    10-Jan-16 12:01, Comprehensive Metabolic Panel  Glucose, Serum 238  BUN 19  Creatinine (comp) 0.97  Sodium, Serum 132  Potassium, Serum 3.7  Chloride, Serum 91  CO2, Serum 34  Calcium (Total), Serum 9.7  Osmolality (calc) 275  eGFR (African American) >60  eGFR (Non-African American) 58  eGFR values <35m/min/1.73 m2 may be an indication of chronic  kidney disease (CKD).  Calculated eGFR, using the MRDR Study equation, is useful in   patients with stable  renal function.  The eGFR calculation will not be reliable in acutely ill patients  when serum creatinine is changing rapidly. It is not useful in  patients on dialysis. The eGFR calculation may not be applicable  to patients at the low and high extremes of body sizes, pregnant  women, and vegetarians.  Anion Gap 7    10-Jan-16 12:01, Lipase  Lipase 222  Result(s) reported on 02 Dec 2014 at 12:26PM.    11-Jan-16 06:01, Comprehensive Metabolic Panel  Glucose, Serum 109  BUN 11  Creatinine (comp) 0.75  Sodium, Serum 136  Potassium, Serum 3.0  Chloride, Serum 99  CO2, Serum 30  Calcium (Total), Serum 7.9  Osmolality (calc) 272  eGFR (African American) >60  eGFR (Non-African American) >60  eGFR values <641mmin/1.73 m2  may be an indication of chronic  kidney disease (CKD).  Calculated eGFR, using the MRDR Study equation, is useful in   patients with stable renal function.  The eGFR calculation will not be reliable in acutely ill patients  when serum creatinine is changing rapidly. It is not useful in  patients on dialysis. The eGFR calculation may not be applicable  to patients at the low and high extremes of body sizes, pregnant  women, and vegetarians.  Anion Gap 7  Cardiac:    10-Jan-16 12:01, Troponin I  Troponin I 0.04  0.00-0.05  0.05 ng/mL or less: NEGATIVE   Repeat testing in 3-6 hrs   if clinically indicated.  >0.05 ng/mL: POTENTIAL   MYOCARDIAL INJURY. Repeat   testing in 3-6 hrs if   clinically indicated.  NOTE: An increase or decrease   of 30% or more on serial   testing suggests a   clinically important change  Routine UA:    10-Jan-16 12:59, Urinalysis  Color (UA) Amber  Clarity (UA) Clear  Glucose (UA) 50 mg/dL  Bilirubin (UA) Negative  Ketones (UA) Trace  Specific Gravity (UA) 1.015  Blood (UA) 1+  pH (UA) 6.0  Protein (UA)   100 mg/dL  Nitrite (UA) Negative  Leukocyte Esterase (UA) Negative  Result(s) reported on 02 Dec 2014 at 01:13PM.  RBC  (UA) <1 /HPF  WBC (UA) 1 /HPF  Bacteria (UA) 1+  Epithelial Cells (UA) <1 /HPF  Mucous (UA) PRESENT  Hyaline Cast (UA) 2 /LPF  Amorphous Crystal (UA) PRESENT  Result(s) reported on 02 Dec 2014 at 01:13PM.  Routine Hem:    10-Jan-16 12:01, Hemogram, Platelet Count  WBC (CBC) 14.3  RBC (CBC) 4.49  Hemoglobin (CBC) 13.9  Hematocrit (CBC) 42.4  Platelet Count (CBC) 237  Result(s) reported on 02 Dec 2014 at 12:20PM.  MCV 95  MCH 30.9  MCHC 32.7  RDW 12.8    11-Jan-16 06:01, CBC Profile  WBC (CBC) 12.5  RBC (CBC) 3.61  Hemoglobin (CBC) 11.2  Hematocrit (CBC) 34.4  Platelet Count (CBC) 167  MCV 95  MCH 31.0  MCHC 32.6  RDW 12.4  Neutrophil % 87.0  Lymphocyte % 4.6  Monocyte % 8.2  Eosinophil % 0.1  Basophil % 0.1  Neutrophil # 10.9  Lymphocyte # 0.6  Monocyte # 1.0  Eosinophil # 0.0  Basophil # 0.0  Result(s) reported on 03 Dec 2014 at 06:43AM.   RADIOLOGY:  Radiology Results: XRay:    17-Sep-15 21:18, Chest PA and Lateral  Chest PA and Lateral  REASON FOR EXAM:    Shortness of Breath  COMMENTS:   May transport without cardiac monitor    PROCEDURE: DXR - DXR CHEST PA (OR AP) AND LATERAL  - Aug 09 2014  9:18PM     CLINICAL DATA:  Shortness of breath and weakness.    EXAM:  CHEST  2 VIEW    COMPARISON:  None.    FINDINGS:  The heart is mildly enlarged. There are no focal consolidations or  pleural effusions. Lungs are hyperinflated. No edema. Aorta is  tortuous and calcified. Old rib fractures are noted. There is a  wedge deformity of T6, age indeterminate.     IMPRESSION:  1. Hyperinflation.  2. Cardiomegaly.  3. T6 wedge compression fracture, of indeterminate age.      Electronically Signed    By: Shon Hale M.D.    On: 08/09/2014 22:02         Verified By: Benjamine Mola  D. Owens Shark, M.D.,    [phone] 10:08, Chest Portable Single View  Chest Portable Single View  REASON FOR EXAM:    Chest pain  COMMENTS:       PROCEDURE: DXR - DXR PORTABLE CHEST  SINGLE VIEW  - Oct 01 2014 10:08AM     CLINICAL DATA:  Chest pressure which started yesterday.    EXAM:  PORTABLE CHEST - 1 VIEW    COMPARISON:  August 09, 2014, February 09, 2006    FINDINGS:  The heart size and mediastinal contours are within normal limits.  There is no focal infiltrate, pulmonary edema, or pleural effusion.  Chronic deformity of several lateral left mid ribs are identified.  Corticated bony density associated with the right first rib is  unchanged since 2007.     IMPRESSION:  No active cardiopulmonary disease.      Electronically Signed    By: Abelardo Diesel M.D.    On: 10/01/2014 10:18         Verified By: Abelardo Diesel, M.D.,    10-Jan-16 16:48, Chest PA and Lateral  Chest PA and Lateral  REASON FOR EXAM:    SOB  COMMENTS:       PROCEDURE: DXR - DXR CHEST PA (OR AP) AND LATERAL  - Dec 02 2014  4:48PM     CLINICAL DATA:  Patient with GI bleed.  History of COPD.    EXAM:  CHEST  2 VIEW    COMPARISON:  10/01/2014.    FINDINGS:  Stable cardiac and mediastinal contours with tortuosity of the  thoracic aorta. No consolidative pulmonary opacities. No pleural  effusion or pneumothorax. Old posterior left lateral rib fractures.  Mid thoracic spine degenerative changes.     IMPRESSION:  No acute cardiopulmonary process.      Electronically Signed    By: Lovey Newcomer M.D.    On: 12/02/2014 17:27         Verified By: Ilsa Iha, M.D.,  LabUnknown:    17-Sep-15 21:18, Chest PA and Lateral  PACS Image    09-Nov-15 10:08, Chest Portable Single View  PACS Image    10-Jan-16 14:40, CT Abdomen and Pelvis With Contrast  PACS Image    10-Jan-16 16:48, Chest PA and Lateral  PACS Image  CT:    10-Jan-16 14:40, CT Abdomen and Pelvis With Contrast  CT Abdomen and Pelvis With Contrast  REASON FOR EXAM:    (1) lower abdominal pain, nausea, diarrhea; (2) lower   abdominal pain, nausea, di  COMMENTS:       PROCEDURE: CT  - CT ABDOMEN / PELVIS  W   - Dec 02 2014  2:40PM     CLINICAL DATA:  81 year old female with lower abdominal pain,  nausea, vomiting and diarrhea. Blood in the stool starting today.    EXAM:  CT ABDOMEN AND PELVIS WITH CONTRAST    TECHNIQUE:  Multidetector CT imaging of the abdomen and pelvis was performed  using the standard protocol following bolus administrationof  intravenous contrast.    CONTRAST:  100 mL of Omnipaque 300.    COMPARISON:  CT of the abdomen and pelvis 07/03/2006.    FINDINGS:  Lower chest: Extensive atherosclerosis in the distal thoracic aorta.    Hepatobiliary: 4 mm low attenuation lesionin segment 2 of the liver  is too small to definitively characterize, but unchanged compared to  prior study from 2007, presumably a small cyst. No other suspicious  appearing cystic or solid  hepatic lesions. No intra or extrahepatic  biliary ductal dilatation. Gallbladder is normal in appearance.  Pancreas: Several coarse calcifications in the uncinate process and  body of the pancreas, where there also appears to be some mild  ductal ectasia, presumably sequela of prior episodes of  pancreatitis.    Spleen: Unremarkable.    Adrenals/Urinary Tract: Bilateral adrenal glands are normal in  appearance. 6 mm nonobstructive calculus in the lower pole  collecting system of the left kidney. Multiple sub cm  low-attenuation lesions in the kidneys bilaterally, too small the  definitively characterize, but favored to represent cysts. No  hydroureteronephrosis. Urinary bladder is normal in appearance.    Stomach/Bowel: The appearance of the stomach is normal. No  pathologic dilatation of small bowel or colon. Profound colonic wall  thickening extending from the distal transverse colon to the level  of the rectum, most severe in the region of the splenic flexure  where there is surrounding inflammatory changes, compatible with  severe acute colitis. More proximal aspect of the colon is otherwise  normal  in appearance.    Vascular/Lymphatic: Extensive atherosclerosis throughout the  abdominal and pelvic vasculature,, including prominent  atherosclerotic plaques at the origin of the celiac axis and  superior mesenteric arteries. Both these vessels are patent at this  time, however, the celiac axis appears focally dilated just beyond  the origin (10 mm in diameter), which may reflect a post stenotic  dilatation. IMA appears grossly patent. No lymphadenopathy noted in  the abdomen or pelvis.  Reproductive: Heterogeneous appearing uterus with multiple all  coarse calcifications, compatible with multiple small fibroids.  Ovaries are atrophic.    Other: No significant volume of ascites.  Nopneumoperitoneum.    Musculoskeletal: There are no aggressive appearing lytic or blastic  lesions noted in the visualized portions of the skeleton. 10 mm of  anterolisthesis of L4 upon L5.     IMPRESSION:  1. Findings, as above, compatible with a colitis extending from the  distal transverse colon to the level of the rectum, most severe in  the region of the splenic flexure.  2. Extensive atherosclerosis, including probable hemodynamically  significant stenosis at the origin of the celiac axis, follow by  poststenotic dilatation. At this time, the celiac axis, SMA and IMA  are all grossly patent.  3. Additional incidental findings, as above.      Electronically Signed    By: Vinnie Langton M.D.    On: 12/02/2014 15:24         Verified By: Etheleen Mayhew, M.D.,   ASSESSMENT AND PLAN:  Assessment/Admission Diagnosis Colitis, possibly ischemic other medical issues as above   Plan Had a CT scan with contrast which I have indepently reviewed.  This was with 5 mm cuts and evaluation of the vasculature was pretty limited, but there is pretty extensive atherosclerosis of the aorta and branch vessels.  Report is made of possible celiac stenosis, and that may be present but this should not really  affect the colon.  SMA and IMA pretty difficult to determine, but SMA does appear patent.  IMA is pretty small.  Official report says this appears to be patent, but I think this is pretty difficult to say for sure.  Location of colitis is left colon/splenic flexure area which would go along with ischemic colitis. Most ischemic colitis are secondary to small vessel disease, but a significant minority have large vessel SMA or IMA disease that is amenable to revascularization to avoid recurrences and  potentially improve flow to help healing.  Based on her CT scan, it is very difficult to determine in her case.  I doubt a CT angiogram will add much to her case.  May benefit from catheter based angiography for further evaluation, and I have discussed this with her today.  If this would be desired from primary service or GI, I will schedule for Wednesday.  Will defer to GI if any endoluminal evaluation should be performed.  Will follow   level 4 consult   Electronic Signatures: Algernon Huxley (MD)  (Signed 11-Jan-16 17:05)  Authored: Chief Complaint and History, PAST MEDICAL/SURGICAL HISTORY, ALLERGIES, HOME MEDICATIONS, Family and Social History, Review of Systems, Physical Exam, LABS, RADIOLOGY, Assessment and Plan   Last Updated: 11-Jan-16 17:05 by Algernon Huxley (MD)

## 2015-03-24 NOTE — Op Note (Signed)
PATIENT NAME:  [name], [name] MR#:  [id number] DATE OF BIRTH:  07/09/1925  DATE OF PROCEDURE:  12/06/2014  PREOPERATIVE DIAGNOSES:  1.  Ischemic colitis.  2.  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   POSTOPERATIVE DIAGNOSES: 1.  Ischemic colitis.  2.  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    PROCEDURES:  1.  Ultrasound guidance for vascular access, right femoral artery.  2.  Catheter placement into superior mesenteric artery and inferior mesenteric artery from right femoral approach.  3.  Aortogram and selective superior mesenteric artery and inferior mesenteric artery arteriogram.  4.  StarClose closure device, right femoral artery.   SURGEON: Annice Needy, MD.   ANESTHESIA: Local with moderate conscious sedation.   ESTIMATED BLOOD LOSS: Minimal.   CONTRAST USED: 55 mL Visipaque.   INDICATION FOR PROCEDURE: An 81 year old female who was admitted several days ago with ischemic colitis. This was biopsy proven on flexible sigmoidoscopy yesterday morning and was found to be consistent with chronic ischemic changes of the sigmoid colon and splenic flexure. She had a CT scan which showed diffuse atherosclerosis of her aorta and branch vessels for this reason a mesenteric angiogram was performed to see if any large vessel stenoses are contributing to her ischemia and if so these will be corrected.  Risks and benefits were discussed. Informed consent was obtained.   DESCRIPTION OF PROCEDURE: The patient is brought to the vascular suite. Groins were shaved and prepped and a sterile surgical field was created.  The right femoral head was localized with fluoroscopy.  Right femoral artery was visualized with ultrasound and accessed under direct ultrasound guidance without difficulty with a Seldinger needle.  A J-wire and 5 French sheath were then placed. The patient was given 2500 units of intravenous heparin. Pigtail catheter was placed in the aorta at the T12-L1 level and AP and then a steep RAO projection aortogram was  performed.  On this, the origins of the SMA and celiac artery could be identified. The celiac did clearly have stenosis about a centimeter beyond its origin with post-stenotic dilatation, however, this would not be contributing to any colon flow and so treatment of this lesion was not considered as it was not likely to be symptomatic and certainly is not contributing to her colonic issues.  The SMA was selectively cannulated with a C2 catheter. Selective injections were performed in the steep oblique and then AP projection. This showed the main SMA to be widely patent without significant stenoses.  There was not a good middle colic identified which may be contributing to her poor collateralization and watershed area of the splenic flexure, but there was no lesion amenable to percutaneous revascularization.  I then went down to the mid infrarenal aorta and went into a moderate RAO projection to evaluate the IMA, and what appeared to be a reasonably large IMA with calcium at its origin was identified.  I then used a VS1 catheter to select, and we cannulate this without difficulty and confirmed the IMA to be large and patent without significant stenoses and good flow throughout the sigmoid and left colon.  At this point, there were no lesions amenable to percutaneous revascularization, no large vessel superior mesenteric artery or inferior mesenteric artery stenosis contributing to her problems.  The diagnostic catheter was removed. Oblique arteriogram was performed of the right femoral artery and StarClose closure device was deployed in the usual fashion with excellent hemostatic result. The patient tolerated the procedure well and was taken to the recovery room  in stable condition.      ____________________________ Annice NeedyJason S. Rumaldo Difatta, MD jsd:DT D: 12/06/2014 10:52:56 ET T: 12/06/2014 16:55:18 ET JOB#: [id number]  cc: Annice NeedyJason S. Adisson Deak, MD, <Dictator> Annice NeedyJASON S Chris Narasimhan MD ELECTRONICALLY SIGNED 12/13/2014 10:40

## 2015-03-24 NOTE — Consult Note (Signed)
Chief Complaint:  Subjective/Chief Complaint patient seen for n/v rectal bleeding and abdominal pain.  no n/v currently, small amount of bloode stool this am.  continues to feel "bloated"   VITAL SIGNS/ANCILLARY NOTES: **Vital Signs.:   12-Jan-16 08:05  Vital Signs Type Q 4hr  Temperature Temperature (F) 98.4  Celsius 36.8  Respirations Respirations 21  Systolic BP Systolic BP 520  Diastolic BP (mmHg) Diastolic BP (mmHg) 78  Mean BP 113  Pulse Ox % Pulse Ox % 92  Pulse Ox Activity Level  At rest  Oxygen Delivery 3L  *Intake and Output.:   12-Jan-16 10:27  Stool  Patient had a very small loose red bowel movement.    11:30  Stool  small clot with pink tinged liquid   Brief Assessment:  Cardiac Regular   Respiratory clear BS   Gastrointestinal details normal Soft  Bowel sounds normal  No rebound tenderness  mild distension, mild discomfort to palpation in the lower abdomen.   Lab Results: Routine Chem:  12-Jan-16 04:46   Glucose, Serum  100  BUN 8  Creatinine (comp) 0.88  Sodium, Serum 136  Potassium, Serum  3.3  Chloride, Serum 103  CO2, Serum 28  Calcium (Total), Serum  7.9  Anion Gap  5  Osmolality (calc) 270  eGFR (African American) >60  eGFR (Non-African American) >60 (eGFR values <71m/min/1.73 m2 may be an indication of chronic kidney disease (CKD). Calculated eGFR, using the MRDR Study equation, is useful in  patients with stable renal function. The eGFR calculation will not be reliable in acutely ill patients when serum creatinine is changing rapidly. It is not useful in patients on dialysis. The eGFR calculation may not be applicable to patients at the low and high extremes of body sizes, pregnant women, and vegetarians.)  Routine Hem:  10-Jan-16 12:01   Hemoglobin (CBC) 13.9  11-Jan-16 06:01   Hemoglobin (CBC)  11.2    18:18   Hemoglobin (CBC)  10.9 (Result(s) reported on 03 Dec 2014 at 06:34PM.)  12-Jan-16 04:46   WBC (CBC)  12.7  RBC (CBC)   3.57  Hemoglobin (CBC)  11.2  Hematocrit (CBC)  34.0  Platelet Count (CBC) 174  MCV 95  MCH 31.4  MCHC 32.9  RDW 13.1  Neutrophil % 81.5  Lymphocyte % 8.7  Monocyte % 8.3  Eosinophil % 1.3  Basophil % 0.2  Neutrophil #  10.3  Lymphocyte # 1.1  Monocyte #  1.1  Eosinophil # 0.2  Basophil # 0.0 (Result(s) reported on 04 Dec 2014 at 05:09AM.)   Assessment/Plan:  Assessment/Plan:  Assessment 1) hematochezia, abnormal ct with left sided colitis, DDX, ischemic versus infective colitis, less likely inflammatory.   2) h/o Chronic respiratory failure, COPD on O2 therapy, hyperlipidemia, hypertension, osteoporosis, osteoarthritis, kidney stones.   Plan 1) flexible sigmoidoscopy tomorrow am.  I have discussed the risks benefits and complications of proceedure to include to limited to bleeding infection perforation and sedation and she wishes to proceed.  Also discussed with patients daughter.  Discussed with Dr DLucky Cowboy   Electronic Signatures: SLoistine Simas(MD)  (Signed 12-Jan-16 13:04)  Authored: Chief Complaint, VITAL SIGNS/ANCILLARY NOTES, Brief Assessment, Lab Results, Assessment/Plan   Last Updated: 12-Jan-16 13:04 by SLoistine Simas(MD)

## 2015-03-24 NOTE — Consult Note (Signed)
Brief Consult Note: Diagnosis: abdominal pain.   Patient was seen by consultant.   Consult note dictated.   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 reports sudden onset of mid central abdominal pain that travelled down the left side of her abdomen either yesterday or the day prior. Associated with bloody diarrhea: states as frequently as every 3961m in the beginning, and then improved. Passed 1 tiny red movement today, described by her nurse as looking like a red clot. States she is no longer having any abdominal pain- seems to have resolved overnight. Denies further GI complaints. Does take about 3 Aleve daily and has been taking some Senna- but can't state how often.  Last colonoscopy many,many years ago.  CT abdomen with the following findings: diffuse colonic wall thickening to the mid xverse colon to the rectum, significant plaques to the celiac/SMA. IMA patent; changes of chronic pancreatitis. Celiac also somewhat dilated with stenosis. Has vascular consult pending. Currently on PPI, Cipro, and Flagyl. Afebrile. Hemodynamically stable. Impression and plan. Acute abdominal pain. Hisotry of bloody diarrhea. Both of these improved. With her recent CT, agree with abx and current. Anticipating vasc cx. Will discuss further with Dr Marva PandaSkulskie, consider f/s, cta, v other therapy.  Electronic Signatures: Vevelyn PatLondon, Christiane H (NP)  (Signed 11-Jan-16 14:50)  Authored: Brief Consult Note   Last Updated: 11-Jan-16 14:50 by Keturah BarreLondon, Christiane H (NP)

## 2015-04-02 DIAGNOSIS — F329 Major depressive disorder, single episode, unspecified: Secondary | ICD-10-CM | POA: Diagnosis not present

## 2015-04-02 LAB — TSH: TSH: 2.859 u[IU]/mL (ref 0.350–4.500)

## 2015-04-03 LAB — VITAMIN D 25 HYDROXY (VIT D DEFICIENCY, FRACTURES): VIT D 25 HYDROXY: 50.8 ng/mL (ref 30.0–100.0)

## 2015-06-24 ENCOUNTER — Encounter
Admission: RE | Admit: 2015-06-24 | Discharge: 2015-06-24 | Disposition: A | Discharge status: Home / Self Care | Payer: BC Managed Care – PPO | Source: Ambulatory Visit | Attending: Internal Medicine | Admitting: Internal Medicine

## 2015-07-01 ENCOUNTER — Other Ambulatory Visit
Admission: RE | Admit: 2015-07-01 | Discharge: 2015-07-01 | Disposition: A | Discharge status: Home / Self Care | Payer: Medicare Other | Source: Ambulatory Visit | Attending: Internal Medicine | Admitting: Internal Medicine

## 2015-07-01 DIAGNOSIS — N390 Urinary tract infection, site not specified: Secondary | ICD-10-CM | POA: Insufficient documentation

## 2015-07-01 LAB — URINALYSIS COMPLETE WITH MICROSCOPIC (ARMC ONLY)
Bacteria, UA: NONE SEEN
Bilirubin Urine: NEGATIVE
Glucose, UA: NEGATIVE mg/dL
Hgb urine dipstick: NEGATIVE
KETONES UR: NEGATIVE mg/dL
Nitrite: NEGATIVE
PH: 7 (ref 5.0–8.0)
Protein, ur: NEGATIVE mg/dL
Specific Gravity, Urine: 1.006 (ref 1.005–1.030)
Squamous Epithelial / LPF: NONE SEEN

## 2015-07-26 ENCOUNTER — Encounter
Admission: RE | Admit: 2015-07-26 | Discharge: 2015-07-26 | Disposition: A | Discharge status: Home / Self Care | Payer: BC Managed Care – PPO | Source: Ambulatory Visit | Attending: Internal Medicine | Admitting: Internal Medicine

## 2015-08-24 ENCOUNTER — Encounter
Admission: RE | Admit: 2015-08-24 | Discharge: 2015-08-24 | Disposition: A | Discharge status: Home / Self Care | Payer: BC Managed Care – PPO | Source: Ambulatory Visit | Attending: Internal Medicine | Admitting: Internal Medicine

## 2015-09-24 ENCOUNTER — Encounter
Admission: RE | Admit: 2015-09-24 | Discharge: 2015-09-24 | Disposition: A | Discharge status: Home / Self Care | Payer: Medicare Other | Source: Ambulatory Visit | Attending: Internal Medicine | Admitting: Internal Medicine

## 2015-09-24 DIAGNOSIS — J449 Chronic obstructive pulmonary disease, unspecified: Secondary | ICD-10-CM | POA: Insufficient documentation

## 2015-09-24 DIAGNOSIS — I10 Essential (primary) hypertension: Secondary | ICD-10-CM | POA: Insufficient documentation

## 2015-09-24 LAB — BASIC METABOLIC PANEL
Anion gap: 9 (ref 5–15)
BUN: 21 mg/dL — AB (ref 6–20)
CALCIUM: 9.5 mg/dL (ref 8.9–10.3)
CO2: 33 mmol/L — ABNORMAL HIGH (ref 22–32)
Chloride: 95 mmol/L — ABNORMAL LOW (ref 101–111)
Creatinine, Ser: 0.78 mg/dL (ref 0.44–1.00)
GFR calc Af Amer: >60 mL/min (ref >60)
GFR calc non Af Amer: >60 mL/min (ref >60)
GLUCOSE: 82 mg/dL (ref 65–99)
Potassium: 3.9 mmol/L (ref 3.5–5.1)
Sodium: 137 mmol/L (ref 135–145)

## 2015-10-11 ENCOUNTER — Encounter
Admission: RE | Admit: 2015-10-11 | Discharge: 2015-10-11 | Disposition: A | Discharge status: Home / Self Care | Payer: Medicare Other | Source: Ambulatory Visit | Attending: Internal Medicine | Admitting: Internal Medicine

## 2015-10-11 DIAGNOSIS — F329 Major depressive disorder, single episode, unspecified: Secondary | ICD-10-CM | POA: Insufficient documentation

## 2015-10-15 DIAGNOSIS — F329 Major depressive disorder, single episode, unspecified: Secondary | ICD-10-CM | POA: Diagnosis not present

## 2015-10-15 LAB — TSH: TSH: 1.425 u[IU]/mL (ref 0.350–4.500)

## 2015-10-16 LAB — VITAMIN D 25 HYDROXY (VIT D DEFICIENCY, FRACTURES): VIT D 25 HYDROXY: 37.6 ng/mL (ref 30.0–100.0)

## 2015-10-24 ENCOUNTER — Encounter
Admission: RE | Admit: 2015-10-24 | Discharge: 2015-10-24 | Disposition: A | Discharge status: Home / Self Care | Payer: BC Managed Care – PPO | Source: Ambulatory Visit | Attending: Internal Medicine | Admitting: Internal Medicine

## 2015-11-24 ENCOUNTER — Encounter
Admission: RE | Admit: 2015-11-24 | Discharge: 2015-11-24 | Disposition: A | Discharge status: Home / Self Care | Payer: BC Managed Care – PPO | Source: Ambulatory Visit | Attending: Internal Medicine | Admitting: Internal Medicine

## 2015-12-25 ENCOUNTER — Encounter
Admission: RE | Admit: 2015-12-25 | Discharge: 2015-12-25 | Disposition: A | Discharge status: Home / Self Care | Payer: BC Managed Care – PPO | Source: Ambulatory Visit | Attending: Internal Medicine | Admitting: Internal Medicine

## 2016-01-22 ENCOUNTER — Encounter
Admission: RE | Admit: 2016-01-22 | Discharge: 2016-01-22 | Disposition: A | Discharge status: Home / Self Care | Payer: Medicare PPO | Source: Ambulatory Visit | Attending: Internal Medicine | Admitting: Internal Medicine

## 2016-01-22 DIAGNOSIS — R410 Disorientation, unspecified: Secondary | ICD-10-CM | POA: Insufficient documentation

## 2016-01-31 DIAGNOSIS — R410 Disorientation, unspecified: Secondary | ICD-10-CM | POA: Diagnosis present

## 2016-01-31 LAB — URINALYSIS COMPLETE WITH MICROSCOPIC (ARMC ONLY)
BILIRUBIN URINE: NEGATIVE
Bacteria, UA: NONE SEEN
GLUCOSE, UA: NEGATIVE mg/dL
Hgb urine dipstick: NEGATIVE
Ketones, ur: NEGATIVE mg/dL
Leukocytes, UA: NEGATIVE
Nitrite: NEGATIVE
PH: 7 (ref 5.0–8.0)
Protein, ur: NEGATIVE mg/dL
RBC / HPF: NONE SEEN RBC/hpf (ref 0–5)
Specific Gravity, Urine: 1.01 (ref 1.005–1.030)

## 2016-02-02 LAB — URINE CULTURE: Culture: NO GROWTH

## 2016-02-22 ENCOUNTER — Encounter
Admission: RE | Admit: 2016-02-22 | Discharge: 2016-02-22 | Disposition: A | Discharge status: Home / Self Care | Payer: BC Managed Care – PPO | Source: Ambulatory Visit | Attending: Internal Medicine | Admitting: Internal Medicine

## 2016-03-23 ENCOUNTER — Encounter
Admission: RE | Admit: 2016-03-23 | Discharge: 2016-03-23 | Disposition: A | Discharge status: Home / Self Care | Payer: BC Managed Care – PPO | Source: Ambulatory Visit | Attending: Internal Medicine | Admitting: Internal Medicine

## 2016-04-23 ENCOUNTER — Encounter
Admission: RE | Admit: 2016-04-23 | Discharge: 2016-04-23 | Disposition: A | Discharge status: Home / Self Care | Payer: BC Managed Care – PPO | Source: Ambulatory Visit | Attending: Internal Medicine | Admitting: Internal Medicine

## 2016-05-23 ENCOUNTER — Encounter
Admission: RE | Admit: 2016-05-23 | Discharge: 2016-05-23 | Disposition: A | Discharge status: Home / Self Care | Payer: BC Managed Care – PPO | Source: Ambulatory Visit | Attending: Internal Medicine | Admitting: Internal Medicine

## 2016-06-23 ENCOUNTER — Encounter
Admission: RE | Admit: 2016-06-23 | Discharge: 2016-06-23 | Disposition: A | Discharge status: Home / Self Care | Payer: BC Managed Care – PPO | Source: Ambulatory Visit | Attending: Internal Medicine | Admitting: Internal Medicine

## 2016-07-24 ENCOUNTER — Encounter
Admission: RE | Admit: 2016-07-24 | Discharge: 2016-07-24 | Disposition: A | Discharge status: Home / Self Care | Payer: BC Managed Care – PPO | Source: Ambulatory Visit | Attending: Internal Medicine | Admitting: Internal Medicine

## 2016-08-23 ENCOUNTER — Encounter
Admission: RE | Admit: 2016-08-23 | Discharge: 2016-08-23 | Disposition: A | Discharge status: Home / Self Care | Payer: BC Managed Care – PPO | Source: Ambulatory Visit | Attending: Internal Medicine | Admitting: Internal Medicine

## 2016-09-23 ENCOUNTER — Encounter
Admission: RE | Admit: 2016-09-23 | Discharge: 2016-09-23 | Disposition: A | Discharge status: Home / Self Care | Payer: BC Managed Care – PPO | Source: Ambulatory Visit | Attending: Internal Medicine | Admitting: Internal Medicine

## 2016-10-02 ENCOUNTER — Non-Acute Institutional Stay: Payer: Medicare PPO | Admitting: Gerontology

## 2016-10-02 DIAGNOSIS — J449 Chronic obstructive pulmonary disease, unspecified: Secondary | ICD-10-CM

## 2016-10-11 DIAGNOSIS — J449 Chronic obstructive pulmonary disease, unspecified: Secondary | ICD-10-CM | POA: Insufficient documentation

## 2016-10-11 NOTE — Progress Notes (Signed)
Location:      Place of Service:  ALF (13) Provider:  Lorenso Quarry, NP-C  Lauro Regulus., MD  Patient Care Team: Lauro Regulus, MD as PCP - General (Internal Medicine)  Extended Emergency Contact Information Primary Emergency Contact: August Luz Address: [address]          [place], [place] [address] Home Phone: [phone] Relation: None  Code Status:  full Goals of care: Advanced Directive information No flowsheet data found.   Chief Complaint  Patient presents with  . Acute Visit    HPI:  Pt is a 82 y.o. female seen today for an acute visit for dyspnea, wheezing, low sats. Upon assessment, pt is lying in bed, napping. Awakens easily. Pt reports cough and "a little harder to breathe," but denies chest pain and "shortness of breath." Pt is afebrile. Cough is currently non-productive. No c/o n/v/d/f/c/cp/sob//ha/abd pain/ dizziness, etc. Pt reports sx started this morning. VSS. No other complaints.    Past Medical History:  Diagnosis Date  . COPD (chronic obstructive pulmonary disease)    on home oxygen  . Hypertension   . Rheumatoid arthritis    No past surgical history on file.  Allergies  Allergen Reactions  . Ace Inhibitors Hives  . Penicillins Rash      Medication List       Accurate as of 10/02/16 11:59 PM. Always use your most recent med list.          ADVAIR DISKUS 100-50 MCG/DOSE Aepb Generic drug:  Fluticasone-Salmeterol daily.   amLODipine 5 MG tablet Commonly known as:  NORVASC Take 5 mg by mouth daily.   aspirin 81 MG tablet Take by mouth every other day.   citalopram 10 MG tablet Commonly known as:  CELEXA Take 10 mg by mouth daily.   furosemide 20 MG tablet Commonly known as:  LASIX Take 20 mg by mouth every other day.   isosorbide mononitrate 30 MG 24 hr tablet Commonly known as:  IMDUR Take 1 tablet (30 mg total) by mouth daily.   losartan 25 MG tablet Commonly known as:  COZAAR Take 25 mg by mouth  daily.   metoprolol tartrate 25 MG tablet Commonly known as:  LOPRESSOR Take 25 mg by mouth daily.   mirtazapine 15 MG tablet Commonly known as:  REMERON Take 15 mg by mouth daily.   NITROSTAT 0.4 MG SL tablet Generic drug:  nitroGLYCERIN Take 0.4 mg by mouth as needed.   PROAIR HFA 108 (90 Base) MCG/ACT inhaler Generic drug:  albuterol   SPIRIVA HANDIHALER 18 MCG inhalation capsule Generic drug:  tiotropium Take 18 mcg by mouth as needed.   traZODone 100 MG tablet Commonly known as:  DESYREL Take 100 mg by mouth daily.   VYTORIN 10-20 MG tablet Generic drug:  ezetimibe-simvastatin daily.       Review of Systems  Constitutional: Negative for activity change, appetite change, chills, diaphoresis and fever.  HENT: Positive for congestion. Negative for sneezing, sore throat, trouble swallowing and voice change.   Respiratory: Positive for cough, shortness of breath and wheezing. Negative for apnea, choking and chest tightness.   Cardiovascular: Negative for chest pain, palpitations and leg swelling.  Gastrointestinal: Negative.   Genitourinary: Negative.   Musculoskeletal: Negative for back pain, gait problem and myalgias. Arthralgias: typical arthritis.  Skin: Negative for color change, pallor, rash and wound.  Neurological: Negative for dizziness, tremors, syncope, speech difficulty, weakness, numbness and headaches.  Psychiatric/Behavioral: Negative for agitation and behavioral problems.  All other systems reviewed and are negative.    There is no immunization history on file for this patient. Pertinent  Health Maintenance Due  Topic Date Due  . DEXA SCAN  11/10/1992  . PNA vac Low Risk Adult (1 of 2 - PCV13) 11/10/1992  . INFLUENZA VACCINE  06/23/2016   No flowsheet data found. Functional Status Survey:    Vitals:   10/02/16 0730  BP: (!) 160/80  Pulse: 80   There is no height or weight on file to calculate BMI. Physical Exam  Constitutional: She is  oriented to person, place, and time. Vital signs are normal. She appears well-developed and well-nourished. She is active and cooperative. She does not appear ill. No distress.  HENT:  Head: Normocephalic and atraumatic.  Mouth/Throat: Uvula is midline, oropharynx is clear and moist and mucous membranes are normal. Mucous membranes are not pale, not dry and not cyanotic.  Eyes: Conjunctivae, EOM and lids are normal. Pupils are equal, round, and reactive to light.  Neck: Trachea normal, normal range of motion and full passive range of motion without pain. Neck supple. No JVD present. No tracheal deviation, no edema and no erythema present. No thyromegaly present.  Cardiovascular: Normal rate, regular rhythm, normal heart sounds, intact distal pulses and normal pulses.  Exam reveals no gallop, no distant heart sounds, no friction rub and no decreased pulses.   No murmur heard. Pulmonary/Chest: Effort normal. No accessory muscle usage. No respiratory distress. She has no decreased breath sounds. She has wheezes in the right upper field, the right middle field, the right lower field, the left upper field, the left middle field and the left lower field. She has rhonchi in the right upper field, the right middle field, the right lower field, the left upper field, the left middle field and the left lower field. She has no rales. She exhibits no tenderness.  Abdominal: Normal appearance and bowel sounds are normal. She exhibits no distension and no ascites. There is no tenderness.  Musculoskeletal: Normal range of motion. She exhibits no edema or tenderness.  Expected osteoarthritis, stiffness  Neurological: She is alert and oriented to person, place, and time. She has normal strength.  Skin: Skin is warm, dry and intact. She is not diaphoretic. No cyanosis. No pallor. Nails show no clubbing.  Psychiatric: She has a normal mood and affect. Her speech is normal and behavior is normal. Judgment and thought  content normal. Cognition and memory are normal.  Nursing note and vitals reviewed.   Labs reviewed: No results for input(s): NA, K, CL, CO2, GLUCOSE, BUN, CREATININE, CALCIUM, MG, PHOS in the last 8760 hours. No results for input(s): AST, ALT, ALKPHOS, BILITOT, PROT, ALBUMIN in the last 8760 hours. No results for input(s): WBC, NEUTROABS, HGB, HCT, MCV, PLT in the last 8760 hours. Lab Results  Component Value Date   TSH 1.425 10/15/2015   No results found for: HGBA1C No results found for: CHOL, HDL, LDLCALC, LDLDIRECT, TRIG, CHOLHDL  Significant Diagnostic Results in last 30 days:  No results found.  Assessment/Plan 1. Chronic obstructive pulmonary disease, unspecified COPD type (HCC)  Solumedrol 125 mg IM x 1 now,   Repeat in 12 hours  Prednisone taper 60 mg x 3, 40 mg x 3, 20 mg x 3, 10 mg x 3  Duo-nebs TID scheduled x 3 days  Guaifenesin 10 mL po Q 4 hours prn  Encourage po fluid intake  Family/ staff Communication:   Total Time:  Documentation:  Face  to Face:  Family/Phone:   Labs/tests ordered:  2 view cxr  Medication list reviewed and assessed for continued appropriateness.  Brynda RimShannon H. Dameon Soltis, NP-C Geriatrics [facility] [phone] [place], [place] [id number] Cell Phone (Mon-Fri 8am-5pm):  [phone] On Call:  [phone] & follow prompts after 5pm & weekends Office Phone:  [phone] Office Fax:  [phone]

## 2016-10-23 ENCOUNTER — Encounter
Admission: RE | Admit: 2016-10-23 | Discharge: 2016-10-23 | Disposition: A | Discharge status: Home / Self Care | Payer: BC Managed Care – PPO | Source: Ambulatory Visit | Attending: Internal Medicine | Admitting: Internal Medicine

## 2016-11-23 ENCOUNTER — Encounter
Admission: RE | Admit: 2016-11-23 | Discharge: 2016-11-23 | Disposition: A | Discharge status: Home / Self Care | Payer: Medicare PPO | Source: Ambulatory Visit | Attending: Internal Medicine | Admitting: Internal Medicine

## 2016-11-23 DIAGNOSIS — R05 Cough: Secondary | ICD-10-CM | POA: Insufficient documentation

## 2016-11-23 DIAGNOSIS — R5381 Other malaise: Secondary | ICD-10-CM | POA: Insufficient documentation

## 2016-12-08 DIAGNOSIS — R05 Cough: Secondary | ICD-10-CM | POA: Diagnosis not present

## 2016-12-08 DIAGNOSIS — R5381 Other malaise: Secondary | ICD-10-CM | POA: Diagnosis not present

## 2016-12-08 LAB — URINALYSIS, ROUTINE W REFLEX MICROSCOPIC
Bilirubin Urine: NEGATIVE
Glucose, UA: NEGATIVE mg/dL
HGB URINE DIPSTICK: NEGATIVE
KETONES UR: NEGATIVE mg/dL
Nitrite: POSITIVE — AB
PROTEIN: NEGATIVE mg/dL
Specific Gravity, Urine: 1.013 (ref 1.005–1.030)
pH: 7 (ref 5.0–8.0)

## 2016-12-10 LAB — URINE CULTURE: Culture: >=100000 — AB

## 2016-12-16 ENCOUNTER — Non-Acute Institutional Stay: Payer: Medicare PPO | Admitting: Gerontology

## 2016-12-16 DIAGNOSIS — J101 Influenza due to other identified influenza virus with other respiratory manifestations: Secondary | ICD-10-CM

## 2016-12-16 DIAGNOSIS — R05 Cough: Secondary | ICD-10-CM | POA: Diagnosis not present

## 2016-12-16 LAB — INFLUENZA PANEL BY PCR (TYPE A & B)
Influenza A By PCR: POSITIVE — AB
Influenza B By PCR: NEGATIVE

## 2016-12-24 ENCOUNTER — Encounter
Admission: RE | Admit: 2016-12-24 | Discharge: 2016-12-24 | Disposition: A | Discharge status: Home / Self Care | Payer: Medicare PPO | Source: Ambulatory Visit | Attending: Internal Medicine | Admitting: Internal Medicine

## 2016-12-30 NOTE — Progress Notes (Signed)
Location:      Place of Service:  ALF (13) Provider:  Lorenso Quarry, NP-C  Lauro Regulus., MD  Patient Care Team: Lauro Regulus, MD as PCP - General (Internal Medicine)  Extended Emergency Contact Information Primary Emergency Contact: August Luz Address: [address]          [place], [place] [address] Home Phone: [phone] Relation: None  Code Status:  full Goals of care: Advanced Directive information No flowsheet data found.   Chief Complaint  Patient presents with  . Acute Visit    HPI:  Pt is a 83 y.o. female seen today for an acute visit for cough, congestion, decreased appetite, general malaise. Pt c/o generally not feeling well. She c/o non-productive cough that has been ongoing for 2 days. C/o nausea and vomiting intermittently. No diarrhea. No dyspnea, no chest pain. Several positive flu cases in the facility. Pt generally looks ill. Appetite poor. Oxygen in place. VSS. No other complaints.    Past Medical History:  Diagnosis Date  . COPD (chronic obstructive pulmonary disease)    on home oxygen  . Hypertension   . Rheumatoid arthritis    No past surgical history on file.  Allergies  Allergen Reactions  . Ace Inhibitors Hives  . Penicillins Rash    Allergies as of 12/16/2016      Reactions   Ace Inhibitors Hives   Penicillins Rash      Medication List       Accurate as of 12/16/16 11:59 PM. Always use your most recent med list.          ADVAIR DISKUS 100-50 MCG/DOSE Aepb Generic drug:  Fluticasone-Salmeterol daily.   amLODipine 5 MG tablet Commonly known as:  NORVASC Take 5 mg by mouth daily.   aspirin 81 MG tablet Take by mouth every other day.   citalopram 10 MG tablet Commonly known as:  CELEXA Take 10 mg by mouth daily.   furosemide 20 MG tablet Commonly known as:  LASIX Take 20 mg by mouth every other day.   isosorbide mononitrate 30 MG 24 hr tablet Commonly known as:  IMDUR Take 1 tablet (30 mg total)  by mouth daily.   losartan 25 MG tablet Commonly known as:  COZAAR Take 25 mg by mouth daily.   metoprolol tartrate 25 MG tablet Commonly known as:  LOPRESSOR Take 25 mg by mouth daily.   mirtazapine 15 MG tablet Commonly known as:  REMERON Take 15 mg by mouth daily.   NITROSTAT 0.4 MG SL tablet Generic drug:  nitroGLYCERIN Take 0.4 mg by mouth as needed.   PROAIR HFA 108 (90 Base) MCG/ACT inhaler Generic drug:  albuterol   SPIRIVA HANDIHALER 18 MCG inhalation capsule Generic drug:  tiotropium Take 18 mcg by mouth as needed.   traZODone 100 MG tablet Commonly known as:  DESYREL Take 100 mg by mouth daily.   VYTORIN 10-20 MG tablet Generic drug:  ezetimibe-simvastatin daily.       Review of Systems  Constitutional: Positive for activity change, appetite change, chills, fatigue and fever. Negative for diaphoresis.  HENT: Positive for congestion. Negative for sneezing, sore throat, trouble swallowing and voice change.   Eyes: Negative for pain, redness and visual disturbance.  Respiratory: Positive for cough. Negative for apnea, choking, chest tightness, shortness of breath and wheezing.   Cardiovascular: Negative.  Negative for chest pain, palpitations and leg swelling.  Gastrointestinal: Negative.  Negative for abdominal distention, abdominal pain, constipation, diarrhea and nausea.  Genitourinary: Negative.  Negative for difficulty urinating, dysuria, frequency and urgency.  Musculoskeletal: Negative.  Negative for back pain, gait problem and myalgias. Arthralgias: typical arthritis.  Skin: Negative for color change, pallor, rash and wound.  Neurological: Negative.  Negative for dizziness, tremors, syncope, speech difficulty, weakness, numbness and headaches.  Psychiatric/Behavioral: Negative.  Negative for agitation and behavioral problems.  All other systems reviewed and are negative.    There is no immunization history on file for this patient. Pertinent   Health Maintenance Due  Topic Date Due  . DEXA SCAN  11/10/1992  . PNA vac Low Risk Adult (1 of 2 - PCV13) 11/10/1992  . INFLUENZA VACCINE  06/23/2016   No flowsheet data found. Functional Status Survey:    Vitals:   12/16/16 1430  BP: (!) 166/76  Pulse: 76   There is no height or weight on file to calculate BMI. Physical Exam  Constitutional: She is oriented to person, place, and time. Vital signs are normal. She appears well-developed and well-nourished. She is active and cooperative. She appears ill. No distress.  HENT:  Head: Normocephalic and atraumatic.  Mouth/Throat: Uvula is midline, oropharynx is clear and moist and mucous membranes are normal. Mucous membranes are not pale, not dry and not cyanotic.  Eyes: Conjunctivae, EOM and lids are normal. Pupils are equal, round, and reactive to light.  Neck: Trachea normal, normal range of motion and full passive range of motion without pain. Neck supple. No JVD present. No tracheal deviation, no edema and no erythema present. No thyromegaly present.  Cardiovascular: Normal rate, normal heart sounds, intact distal pulses and normal pulses.  An irregular rhythm present. Exam reveals no gallop, no distant heart sounds and no friction rub.   No murmur heard. Pulmonary/Chest: Effort normal. No accessory muscle usage. No respiratory distress. She has decreased breath sounds in the right lower field and the left lower field. She has no wheezes. She has rhonchi in the right upper field and the left upper field. She has rales in the right upper field and the left upper field. She exhibits no tenderness.  Abdominal: Soft. Normal appearance and bowel sounds are normal. She exhibits no distension and no ascites. There is no tenderness.  Musculoskeletal: Normal range of motion. She exhibits no edema or tenderness.  Expected osteoarthritis, stiffness  Neurological: She is alert and oriented to person, place, and time. She has normal strength.  Skin:  Skin is warm, dry and intact. She is not diaphoretic. No cyanosis. No pallor. Nails show no clubbing.  Psychiatric: She has a normal mood and affect. Her speech is normal and behavior is normal. Judgment and thought content normal. Cognition and memory are normal.  Nursing note and vitals reviewed.   Labs reviewed: No results for input(s): NA, K, CL, CO2, GLUCOSE, BUN, CREATININE, CALCIUM, MG, PHOS in the last 8760 hours. No results for input(s): AST, ALT, ALKPHOS, BILITOT, PROT, ALBUMIN in the last 8760 hours. No results for input(s): WBC, NEUTROABS, HGB, HCT, MCV, PLT in the last 8760 hours. Lab Results  Component Value Date   TSH 1.425 10/15/2015   No results found for: HGBA1C No results found for: CHOL, HDL, LDLCALC, LDLDIRECT, TRIG, CHOLHDL  Significant Diagnostic Results in last 30 days:  No results found.  Assessment/Plan 1. Influenza A  Encourage po fluid intake  Fever management  Symptom management  Droplet Precautions  Tamiflu 75 mg po BID x 5 days  Monitor closely for s/s of PNA  Family/ staff Communication:   Total Time:  Documentation:  Face to Face:  Family/Phone:   Labs/tests ordered:  Flu swab  Medication list reviewed and assessed for continued appropriateness.  Brynda Rim, NP-C Geriatrics [facility] [phone] [address], [place] [address] Cell Phone (Mon-Fri 8am-5pm):  [phone] On Call:  [phone] & follow prompts after 5pm & weekends Office Phone:  [phone] Office Fax:  [phone]

## 2017-01-19 DIAGNOSIS — J449 Chronic obstructive pulmonary disease, unspecified: Secondary | ICD-10-CM | POA: Insufficient documentation

## 2017-01-19 DIAGNOSIS — I10 Essential (primary) hypertension: Secondary | ICD-10-CM | POA: Insufficient documentation

## 2017-01-19 DIAGNOSIS — F329 Major depressive disorder, single episode, unspecified: Secondary | ICD-10-CM | POA: Diagnosis not present

## 2017-01-19 LAB — CBC WITH DIFFERENTIAL/PLATELET
Basophils Absolute: 0.1 K/uL (ref 0–0.1)
Basophils Relative: 1 %
Eosinophils Absolute: 0.2 K/uL (ref 0–0.7)
Eosinophils Relative: 2 %
HCT: 31.9 % — ABNORMAL LOW (ref 35.0–47.0)
Hemoglobin: 11 g/dL — ABNORMAL LOW (ref 12.0–16.0)
Lymphocytes Relative: 14 %
Lymphs Abs: 1 K/uL (ref 1.0–3.6)
MCH: 30.2 pg (ref 26.0–34.0)
MCHC: 34.5 g/dL (ref 32.0–36.0)
MCV: 87.6 fL (ref 80.0–100.0)
Monocytes Absolute: 0.6 K/uL (ref 0.2–0.9)
Monocytes Relative: 9 %
Neutro Abs: 5.3 K/uL (ref 1.4–6.5)
Neutrophils Relative %: 74 %
Platelets: 267 K/uL (ref 150–440)
RBC: 3.64 MIL/uL — ABNORMAL LOW (ref 3.80–5.20)
RDW: 13.4 % (ref 11.5–14.5)
WBC: 7.2 K/uL (ref 3.6–11.0)

## 2017-01-19 LAB — COMPREHENSIVE METABOLIC PANEL WITH GFR
ALT: 13 U/L — ABNORMAL LOW (ref 14–54)
AST: 29 U/L (ref 15–41)
Albumin: 3.7 g/dL (ref 3.5–5.0)
Alkaline Phosphatase: 68 U/L (ref 38–126)
Anion gap: 5 (ref 5–15)
BUN: 17 mg/dL (ref 6–20)
CO2: 40 mmol/L — ABNORMAL HIGH (ref 22–32)
Calcium: 9.4 mg/dL (ref 8.9–10.3)
Chloride: 92 mmol/L — ABNORMAL LOW (ref 101–111)
Creatinine, Ser: 0.62 mg/dL (ref 0.44–1.00)
GFR calc Af Amer: >60 mL/min
GFR calc non Af Amer: >60 mL/min
Glucose, Bld: 135 mg/dL — ABNORMAL HIGH (ref 65–99)
Potassium: 3.5 mmol/L (ref 3.5–5.1)
Sodium: 137 mmol/L (ref 135–145)
Total Bilirubin: 0.3 mg/dL (ref 0.3–1.2)
Total Protein: 7 g/dL (ref 6.5–8.1)

## 2017-01-19 LAB — LIPID PANEL
CHOL/HDL RATIO: 3.5 ratio
CHOLESTEROL: 159 mg/dL (ref 0–200)
HDL: 46 mg/dL (ref >40)
LDL Cholesterol: 81 mg/dL (ref 0–99)
Triglycerides: 158 mg/dL — ABNORMAL HIGH (ref <150)
VLDL: 32 mg/dL (ref 0–40)

## 2017-01-19 LAB — VITAMIN B12: Vitamin B-12: 607 pg/mL (ref 180–914)

## 2017-01-19 LAB — TSH: TSH: 2.794 u[IU]/mL (ref 0.350–4.500)

## 2017-01-19 LAB — MAGNESIUM: Magnesium: 1.6 mg/dL — ABNORMAL LOW (ref 1.7–2.4)

## 2017-01-20 LAB — VITAMIN D 25 HYDROXY (VIT D DEFICIENCY, FRACTURES): Vit D, 25-Hydroxy: 57.5 ng/mL (ref 30.0–100.0)

## 2017-01-21 ENCOUNTER — Encounter
Admission: RE | Admit: 2017-01-21 | Discharge: 2017-01-21 | Disposition: A | Discharge status: Home / Self Care | Payer: BC Managed Care – PPO | Source: Ambulatory Visit | Attending: Internal Medicine | Admitting: Internal Medicine

## 2017-02-01 ENCOUNTER — Non-Acute Institutional Stay: Payer: Medicare Other | Admitting: Gerontology

## 2017-02-01 DIAGNOSIS — J449 Chronic obstructive pulmonary disease, unspecified: Secondary | ICD-10-CM

## 2017-02-01 DIAGNOSIS — J189 Pneumonia, unspecified organism: Secondary | ICD-10-CM

## 2017-02-01 NOTE — Progress Notes (Signed)
Location:      Place of Service:  ALF (13) Provider:  Lorenso Quarry, NP-C  Lauro Regulus., MD  Patient Care Team: Lauro Regulus, MD as PCP - General (Internal Medicine)  Extended Emergency Contact Information Primary Emergency Contact: August Luz Address: [address]          [place], [place] [address] Home Phone: [phone] Relation: None  Code Status:  DNR Goals of care: Advanced Directive information No flowsheet data found.   Chief Complaint  Patient presents with  . Acute Visit    HPI:  Pt is a 83 y.o. female seen today for an acute visit for dyspnea, low sats. Staff reports pt sats fluctuated over the weekend. Pt has had a congested sounding cough for several days per staff. She has began to have tachycardia and tachypnea. Pt reports she feels "terrible." Staff has been giving her guaifenesin/ guaifenesin-DM without relief. Pt does report difficulty breathing. Denies chest pain. Denies n/v/d/ha/abd pain/dizziness. Intermittent low-grade fever. Coarse sounding cough during exam. Pt is pale. Generally looks ill. Pt is also experiencing increased anxiety, exacerbating the dyspnea. Discussed with staff moving pt upstairs to [facility] for closer monitoring as she is of higher skill need at this time.    Past Medical History:  Diagnosis Date  . COPD (chronic obstructive pulmonary disease)    on home oxygen  . Hypertension   . Rheumatoid arthritis    No past surgical history on file.  Allergies  Allergen Reactions  . Ace Inhibitors Hives  . Penicillins Rash    Allergies as of 02/01/2017      Reactions   Ace Inhibitors Hives   Penicillins Rash      Medication List       Accurate as of 02/01/17 11:04 PM. Always use your most recent med list.          ADVAIR DISKUS 100-50 MCG/DOSE Aepb Generic drug:  Fluticasone-Salmeterol daily.   amLODipine 5 MG tablet Commonly known as:  NORVASC Take 5 mg by mouth daily.   aspirin 81 MG tablet Take by  mouth every other day.   citalopram 10 MG tablet Commonly known as:  CELEXA Take 10 mg by mouth daily.   furosemide 20 MG tablet Commonly known as:  LASIX Take 20 mg by mouth every other day.   isosorbide mononitrate 30 MG 24 hr tablet Commonly known as:  IMDUR Take 1 tablet (30 mg total) by mouth daily.   losartan 25 MG tablet Commonly known as:  COZAAR Take 25 mg by mouth daily.   metoprolol tartrate 25 MG tablet Commonly known as:  LOPRESSOR Take 25 mg by mouth daily.   mirtazapine 15 MG tablet Commonly known as:  REMERON Take 15 mg by mouth daily.   NITROSTAT 0.4 MG SL tablet Generic drug:  nitroGLYCERIN Take 0.4 mg by mouth as needed.   PROAIR HFA 108 (90 Base) MCG/ACT inhaler Generic drug:  albuterol   SPIRIVA HANDIHALER 18 MCG inhalation capsule Generic drug:  tiotropium Take 18 mcg by mouth as needed.   traZODone 100 MG tablet Commonly known as:  DESYREL Take 100 mg by mouth daily.   VYTORIN 10-20 MG tablet Generic drug:  ezetimibe-simvastatin daily.       Review of Systems  Constitutional: Positive for activity change, appetite change, chills, fatigue and fever. Negative for diaphoresis.  HENT: Positive for congestion. Negative for sneezing, sore throat, trouble swallowing and voice change.   Eyes: Negative for pain, redness and visual disturbance.  Respiratory: Positive for cough, chest tightness and shortness of breath. Negative for apnea, choking and wheezing.   Cardiovascular: Negative.  Negative for chest pain, palpitations and leg swelling.  Gastrointestinal: Negative.  Negative for abdominal distention, abdominal pain, constipation, diarrhea and nausea.  Genitourinary: Negative.  Negative for difficulty urinating, dysuria, frequency and urgency.  Musculoskeletal: Negative.  Negative for back pain, gait problem and myalgias. Arthralgias: typical arthritis.  Skin: Negative for color change, pallor, rash and wound.  Neurological: Negative.   Negative for dizziness, tremors, syncope, speech difficulty, weakness, numbness and headaches.  Psychiatric/Behavioral: Negative.  Negative for agitation and behavioral problems.  All other systems reviewed and are negative.    There is no immunization history on file for this patient. Pertinent  Health Maintenance Due  Topic Date Due  . DEXA SCAN  11/10/1992  . PNA vac Low Risk Adult (1 of 2 - PCV13) 11/10/1992  . INFLUENZA VACCINE  06/23/2016   No flowsheet data found. Functional Status Survey:    Vitals:   02/01/17 1745  BP: (!) 147/101  Pulse: (!) 103  Resp: (!) 22  Temp: 97.5 F (36.4 C)  SpO2: 93%  Weight: 98 lb 1.6 oz (44.5 kg)   Body mass index is 18.54 kg/m. Physical Exam  Constitutional: She is oriented to person, place, and time. Vital signs are normal. She appears well-developed and well-nourished. She is active and cooperative. She appears ill. No distress. Nasal cannula in place.  HENT:  Head: Normocephalic and atraumatic.  Mouth/Throat: Uvula is midline, oropharynx is clear and moist and mucous membranes are normal. Mucous membranes are not pale, not dry and not cyanotic.  Eyes: Conjunctivae, EOM and lids are normal. Pupils are equal, round, and reactive to light.  Neck: Trachea normal, normal range of motion and full passive range of motion without pain. Neck supple. No JVD present. No tracheal deviation, no edema and no erythema present. No thyromegaly present.  Cardiovascular: Normal heart sounds, intact distal pulses and normal pulses.  An irregular rhythm present. Tachycardia present.  Exam reveals no gallop, no distant heart sounds and no friction rub.   No murmur heard. Pulmonary/Chest: Effort normal. No accessory muscle usage. No respiratory distress. She has decreased breath sounds in the right lower field and the left lower field. She has no wheezes. She has rhonchi in the right upper field and the left upper field. She has rales in the right upper field  and the left upper field. She exhibits no tenderness.  Lung sounds very "tight". tachypnea  Abdominal: Soft. Normal appearance and bowel sounds are normal. She exhibits no distension and no ascites. There is no tenderness.  Musculoskeletal: Normal range of motion. She exhibits no edema or tenderness.  Expected osteoarthritis, stiffness  Neurological: She is alert and oriented to person, place, and time. She has normal strength.  Skin: Skin is warm, dry and intact. She is not diaphoretic. No cyanosis. No pallor. Nails show no clubbing.  Psychiatric: She has a normal mood and affect. Her speech is normal and behavior is normal. Judgment and thought content normal. Cognition and memory are normal.  Nursing note and vitals reviewed.   Labs reviewed:  Recent Labs  01/19/17 1018  NA 137  K 3.5  CL 92*  CO2 40*  GLUCOSE 135*  BUN 17  CREATININE 0.62  CALCIUM 9.4  MG 1.6*    Recent Labs  01/19/17 1018  AST 29  ALT 13*  ALKPHOS 68  BILITOT 0.3  PROT 7.0  ALBUMIN 3.7    Recent Labs  01/19/17 1018  WBC 7.2  NEUTROABS 5.3  HGB 11.0*  HCT 31.9*  MCV 87.6  PLT 267   Lab Results  Component Value Date   TSH 2.794 01/19/2017   No results found for: HGBA1C Lab Results  Component Value Date   CHOL 159 01/19/2017   HDL 46 01/19/2017   LDLCALC 81 01/19/2017   TRIG 158 (H) 01/19/2017   CHOLHDL 3.5 01/19/2017    Significant Diagnostic Results in last 30 days:  No results found.  Assessment/Plan 1. Chronic obstructive pulmonary disease, unspecified COPD type (HCC)  Solumedrol 125 mg IM x 1 now (at 1030 this am)  Prednisone 60 mg po Q Day x 5 days, then  Prednisone 40 mg po Q day x 5 days, then  Prednisone 20 mg po Q Day x 5 days, then  Prednisone 10 mg po Q Day x 4 days  Duo-neb QID x 3 days  Morphine 20 mg/ ml- 0.25-0.5 mL po/SL Q 1 hour prn pain, dyspnea, anxiety  2. Pneumonia due to infectious organism, unspecified laterality, unspecified part of  lung  Levaquin 500 mg po Q Day x 7 days  Family/ staff Communication:   Total Time:  Documentation:  Face to Face:  Family/Phone:   Labs/tests ordered:  2-view cxr  Medication list reviewed and assessed for continued appropriateness.  Brynda Rim, NP-C Geriatrics [facility] [phone] [place], [place] [address] Cell Phone (Mon-Fri 8am-5pm):  [phone] On Call:  [phone] & follow prompts after 5pm & weekends Office Phone:  [phone] Office Fax:  [phone]

## 2017-02-21 ENCOUNTER — Encounter
Admission: RE | Admit: 2017-02-21 | Discharge: 2017-02-21 | Disposition: A | Discharge status: Home / Self Care | Payer: BC Managed Care – PPO | Source: Ambulatory Visit | Attending: Internal Medicine | Admitting: Internal Medicine

## 2017-02-24 ENCOUNTER — Non-Acute Institutional Stay (SKILLED_NURSING_FACILITY): Payer: Medicare Other | Admitting: Gerontology

## 2017-02-24 DIAGNOSIS — I10 Essential (primary) hypertension: Secondary | ICD-10-CM | POA: Diagnosis not present

## 2017-02-24 DIAGNOSIS — J449 Chronic obstructive pulmonary disease, unspecified: Secondary | ICD-10-CM

## 2017-02-26 ENCOUNTER — Other Ambulatory Visit
Admission: RE | Admit: 2017-02-26 | Discharge: 2017-02-26 | Disposition: A | Discharge status: Home / Self Care | Source: Skilled Nursing Facility | Attending: Gerontology | Admitting: Gerontology

## 2017-02-26 DIAGNOSIS — R350 Frequency of micturition: Secondary | ICD-10-CM | POA: Diagnosis present

## 2017-02-26 DIAGNOSIS — R410 Disorientation, unspecified: Secondary | ICD-10-CM | POA: Insufficient documentation

## 2017-02-26 LAB — URINALYSIS, COMPLETE (UACMP) WITH MICROSCOPIC
BILIRUBIN URINE: NEGATIVE
Bacteria, UA: NONE SEEN
Glucose, UA: NEGATIVE mg/dL
Hgb urine dipstick: NEGATIVE
Ketones, ur: NEGATIVE mg/dL
LEUKOCYTES UA: NEGATIVE
NITRITE: NEGATIVE
PH: 8 (ref 5.0–8.0)
Protein, ur: NEGATIVE mg/dL
RBC / HPF: NONE SEEN RBC/hpf (ref 0–5)
SPECIFIC GRAVITY, URINE: 1.008 (ref 1.005–1.030)
WBC, UA: NONE SEEN WBC/hpf (ref 0–5)

## 2017-02-27 LAB — URINE CULTURE

## 2017-03-01 ENCOUNTER — Non-Acute Institutional Stay (SKILLED_NURSING_FACILITY): Payer: Medicare Other | Admitting: Gerontology

## 2017-03-01 ENCOUNTER — Other Ambulatory Visit
Admission: RE | Admit: 2017-03-01 | Discharge: 2017-03-01 | Disposition: A | Discharge status: Home / Self Care | Source: Skilled Nursing Facility | Attending: Internal Medicine | Admitting: Internal Medicine

## 2017-03-01 DIAGNOSIS — R410 Disorientation, unspecified: Secondary | ICD-10-CM | POA: Diagnosis not present

## 2017-03-01 DIAGNOSIS — R350 Frequency of micturition: Secondary | ICD-10-CM | POA: Diagnosis present

## 2017-03-01 DIAGNOSIS — J449 Chronic obstructive pulmonary disease, unspecified: Secondary | ICD-10-CM

## 2017-03-01 LAB — URINALYSIS, COMPLETE (UACMP) WITH MICROSCOPIC
Bacteria, UA: NONE SEEN
Bilirubin Urine: NEGATIVE
Glucose, UA: NEGATIVE mg/dL
Hgb urine dipstick: NEGATIVE
Ketones, ur: NEGATIVE mg/dL
Nitrite: NEGATIVE
Protein, ur: NEGATIVE mg/dL
SPECIFIC GRAVITY, URINE: 1.017 (ref 1.005–1.030)
pH: 5 (ref 5.0–8.0)

## 2017-03-02 ENCOUNTER — Emergency Department
Admission: EM | Admit: 2017-03-02 | Discharge: 2017-03-02 | Disposition: A | Discharge status: Home / Self Care | Attending: Emergency Medicine | Admitting: Emergency Medicine

## 2017-03-02 ENCOUNTER — Emergency Department

## 2017-03-02 DIAGNOSIS — I10 Essential (primary) hypertension: Secondary | ICD-10-CM | POA: Diagnosis not present

## 2017-03-02 DIAGNOSIS — Y999 Unspecified external cause status: Secondary | ICD-10-CM | POA: Insufficient documentation

## 2017-03-02 DIAGNOSIS — J449 Chronic obstructive pulmonary disease, unspecified: Secondary | ICD-10-CM | POA: Diagnosis not present

## 2017-03-02 DIAGNOSIS — Y939 Activity, unspecified: Secondary | ICD-10-CM | POA: Diagnosis not present

## 2017-03-02 DIAGNOSIS — Z23 Encounter for immunization: Secondary | ICD-10-CM | POA: Diagnosis not present

## 2017-03-02 DIAGNOSIS — W1839XA Other fall on same level, initial encounter: Secondary | ICD-10-CM | POA: Diagnosis not present

## 2017-03-02 DIAGNOSIS — S6991XA Unspecified injury of right wrist, hand and finger(s), initial encounter: Secondary | ICD-10-CM | POA: Diagnosis present

## 2017-03-02 DIAGNOSIS — Y92009 Unspecified place in unspecified non-institutional (private) residence as the place of occurrence of the external cause: Secondary | ICD-10-CM | POA: Diagnosis not present

## 2017-03-02 DIAGNOSIS — Z87891 Personal history of nicotine dependence: Secondary | ICD-10-CM | POA: Insufficient documentation

## 2017-03-02 DIAGNOSIS — S0990XA Unspecified injury of head, initial encounter: Secondary | ICD-10-CM | POA: Insufficient documentation

## 2017-03-02 DIAGNOSIS — S0083XA Contusion of other part of head, initial encounter: Secondary | ICD-10-CM | POA: Diagnosis not present

## 2017-03-02 DIAGNOSIS — S52521A Torus fracture of lower end of right radius, initial encounter for closed fracture: Secondary | ICD-10-CM | POA: Diagnosis not present

## 2017-03-02 DIAGNOSIS — Z79899 Other long term (current) drug therapy: Secondary | ICD-10-CM | POA: Diagnosis not present

## 2017-03-02 DIAGNOSIS — Z7982 Long term (current) use of aspirin: Secondary | ICD-10-CM | POA: Insufficient documentation

## 2017-03-02 DIAGNOSIS — W19XXXA Unspecified fall, initial encounter: Secondary | ICD-10-CM

## 2017-03-02 LAB — BASIC METABOLIC PANEL
ANION GAP: 6 (ref 5–15)
BUN: 35 mg/dL — ABNORMAL HIGH (ref 6–20)
CALCIUM: 9.2 mg/dL (ref 8.9–10.3)
CHLORIDE: 92 mmol/L — AB (ref 101–111)
CO2: 38 mmol/L — AB (ref 22–32)
Creatinine, Ser: 0.98 mg/dL (ref 0.44–1.00)
GFR calc non Af Amer: 50 mL/min — ABNORMAL LOW (ref >60)
GFR, EST AFRICAN AMERICAN: 58 mL/min — AB (ref >60)
GLUCOSE: 122 mg/dL — AB (ref 65–99)
Potassium: 4.2 mmol/L (ref 3.5–5.1)
Sodium: 136 mmol/L (ref 135–145)

## 2017-03-02 LAB — URINALYSIS, COMPLETE (UACMP) WITH MICROSCOPIC
BACTERIA UA: NONE SEEN
Bilirubin Urine: NEGATIVE
Glucose, UA: NEGATIVE mg/dL
Hgb urine dipstick: NEGATIVE
Ketones, ur: NEGATIVE mg/dL
Leukocytes, UA: NEGATIVE
Nitrite: NEGATIVE
PROTEIN: NEGATIVE mg/dL
RBC / HPF: NONE SEEN RBC/hpf (ref 0–5)
SPECIFIC GRAVITY, URINE: 1.017 (ref 1.005–1.030)
pH: 5 (ref 5.0–8.0)

## 2017-03-02 LAB — CBC
HEMATOCRIT: 32.1 % — AB (ref 35.0–47.0)
HEMOGLOBIN: 10.6 g/dL — AB (ref 12.0–16.0)
MCH: 29.7 pg (ref 26.0–34.0)
MCHC: 32.9 g/dL (ref 32.0–36.0)
MCV: 90.4 fL (ref 80.0–100.0)
Platelets: 163 10*3/uL (ref 150–440)
RBC: 3.55 MIL/uL — AB (ref 3.80–5.20)
RDW: 14.5 % (ref 11.5–14.5)
WBC: 8.1 10*3/uL (ref 3.6–11.0)

## 2017-03-02 LAB — URINE CULTURE

## 2017-03-02 MED ORDER — LORAZEPAM 0.5 MG PO TABS
0.2500 mg | ORAL_TABLET | Freq: Once | ORAL | Status: AC
  Administered 2017-03-02: 0.25 mg via ORAL
  Filled 2017-03-02: qty 1

## 2017-03-02 MED ORDER — TETANUS-DIPHTH-ACELL PERTUSSIS 5-2.5-18.5 LF-MCG/0.5 IM SUSP
0.5000 mL | Freq: Once | INTRAMUSCULAR | Status: AC
  Administered 2017-03-02: 0.5 mL via INTRAMUSCULAR
  Filled 2017-03-02: qty 0.5

## 2017-03-02 MED ORDER — SODIUM CHLORIDE 0.9 % IV BOLUS (SEPSIS)
1000.0000 mL | Freq: Once | INTRAVENOUS | Status: AC
  Administered 2017-03-02: 1000 mL via INTRAVENOUS

## 2017-03-02 NOTE — Progress Notes (Signed)
ED visit made. Patient is currently followed by [facility] with a hospice diagnosis of COPD, oxygen dependent. She is a DNR. She was sent to the ED via EMS by [facility] staff after being found on the floor with a hematoma to the right side of her head. Patient seen lying on the ED stretcher, alert able to quietly Wellsite geologist. Daughter Samara Deist at bedside. Paitent was just back for ordered head, spine and maxillofacial CT as well as an xray of her right hand. Results pending. Emotional support given to Samara Deist and Mrs. Lipps. Writer spoke with attending physician Dr. Scotty Court to alert him that hospice is following. Discharge plan pending at this time. Update to hospice team. Thank you. Dayna Barker RN, BSN, [facility] [place], hospital liaison [phone] c

## 2017-03-02 NOTE — ED Notes (Addendum)
Pt placed on bed pan and voided.

## 2017-03-02 NOTE — ED Triage Notes (Addendum)
Pt bib EMS from home w/ c/o multiple falls.  Pt has hematoma to head and ear on R side.  Pt alert, oriented to self and location.  Pt denies LOC, n/v or chgs in vision.  Resp even and unlabored. NAD. No blood thinners listed in medications

## 2017-03-02 NOTE — ED Provider Notes (Signed)
[facility] Emergency Department Provider Note  ____________________________________________  Time seen: Approximately 7:13 PM  I have reviewed the triage vital signs and the nursing notes.   HISTORY  Chief Complaint Fall    HPI [name] is a 83 y.o. female sent to the ED from [facility] due to multiple falls. She has a history of frequent falls and is always supposed to get up with assistance but today she was getting up without help because she forgot and so she fell multiple times. She hit her head and her face had multiple bruises. Also had a bruise on her hand. Denies loss of consciousness, doesn't take blood thinners. No vomiting. Family report that she's not been eating very regularly recently either. She is under care of hospice as well.  Denies headache or vision change.   Past Medical History:  Diagnosis Date  . COPD (chronic obstructive pulmonary disease) (HCC)    on home oxygen  . Hypertension   . Rheumatoid arthritis [facility])      Patient Active Problem List   Diagnosis Date Noted  . Chronic obstructive pulmonary disease (COPD) (HCC) 10/11/2016  . Essential hypertension 10/10/2014  . Chest pain 10/10/2014     History reviewed. No pertinent surgical history.   Prior to Admission medications   Medication Sig Start Date End Date Taking? Authorizing Provider  ADVAIR DISKUS 100-50 MCG/DOSE AEPB daily. 08/23/14   Historical Provider, MD  amLODipine (NORVASC) 5 MG tablet Take 5 mg by mouth daily. 09/26/14   Historical Provider, MD  aspirin 81 MG tablet Take by mouth every other day. 03/21/07   Historical Provider, MD  citalopram (CELEXA) 10 MG tablet Take 10 mg by mouth daily. 09/26/14   Historical Provider, MD  furosemide (LASIX) 20 MG tablet Take 20 mg by mouth every other day. 07/26/14   Historical Provider, MD  isosorbide mononitrate (IMDUR) 30 MG 24 hr tablet Take 1 tablet (30 mg total) by mouth daily. 10/10/14   Iran Ouch, MD   losartan (COZAAR) 25 MG tablet Take 25 mg by mouth daily. 07/11/14   Historical Provider, MD  metoprolol tartrate (LOPRESSOR) 25 MG tablet Take 25 mg by mouth daily. 08/06/14   Historical Provider, MD  mirtazapine (REMERON) 15 MG tablet Take 15 mg by mouth daily. 07/20/14   Historical Provider, MD  NITROSTAT 0.4 MG SL tablet Take 0.4 mg by mouth as needed. 08/11/14   Historical Provider, MD  PROAIR HFA 108 (90 BASE) MCG/ACT inhaler  09/26/14   Historical Provider, MD  SPIRIVA HANDIHALER 18 MCG inhalation capsule Take 18 mcg by mouth as needed. 08/20/14   Historical Provider, MD  traZODone (DESYREL) 100 MG tablet Take 100 mg by mouth daily. 08/17/14   Historical Provider, MD  VYTORIN 10-20 MG per tablet daily. 09/11/14   Historical Provider, MD     Allergies Ace inhibitors and Penicillins   No family history on file.  Social History Social History  Substance Use Topics  . Smoking status: Former Games developer  . Smokeless tobacco: Never Used  . Alcohol use Not on file    Review of Systems  Constitutional:   No fever or chills.  ENT:   No sore throat. No rhinorrhea. Cardiovascular:   No chest pain. Respiratory:   No dyspnea or cough. Gastrointestinal:   Negative for abdominal pain, vomiting and diarrhea.  Genitourinary:   Negative for dysuria or difficulty urinating. Musculoskeletal:   Negative for focal pain or swelling Neurological:   Negative for headaches  10-point ROS otherwise negative.  ____________________________________________   PHYSICAL EXAM:  VITAL SIGNS: ED Triage Vitals  Enc Vitals Group     BP 03/02/17 1513 137/61     Pulse Rate 03/02/17 1513 88     Resp 03/02/17 1513 (!) 24     Temp 03/02/17 1513 97.5 F (36.4 C)     Temp Source 03/02/17 1513 Oral     SpO2 03/02/17 1513 94 %     Weight 03/02/17 1513 98 lb (44.5 kg)     Height --      Head Circumference --      Peak Flow --      Pain Score 03/02/17 1509 4     Pain Loc --      Pain Edu? --      Excl. in GC?  --     Vital signs reviewed, nursing assessments reviewed.   Constitutional:   Alert and oriented. Chronically ill but not in distress. Eyes:   No scleral icterus. No conjunctival pallor. PERRL. EOMI.  No nystagmus. ENT   Head:   Normocephalic with ecchymosis over the right temple and ecchymosis of the right auricle without hematoma formation. No bony point tenderness..   Nose:   No congestion/rhinnorhea. No septal hematoma   Mouth/Throat:   Dry mucous membranes, no pharyngeal erythema. No peritonsillar mass.    Neck:   No stridor. No SubQ emphysema. No meningismus. No midline spinal tenderness.  Hematological/Lymphatic/Immunilogical:   No cervical lymphadenopathy. Cardiovascular:   RRR. Symmetric bilateral radial and DP pulses.  No murmurs.  Respiratory:   Normal respiratory effort without tachypnea nor retractions. Breath sounds are clear and equal bilaterally. No wheezes/rales/rhonchi. Gastrointestinal:   Soft and nontender. Non distended. There is no CVA tenderness.  No rebound, rigidity, or guarding. Genitourinary:   deferred Musculoskeletal:   Normal range of motion in all extremities. No joint effusions.  Positive tenderness to the right hip greater trochanter area. No pain with range of motion though. Does have bruising over the right dorsal hand without bony deformity or point tenderness. No edema. Neurologic:   Normal speech and language.  CN 2-10 normal. Motor grossly intact. No gross focal neurologic deficits are appreciated.  Skin:    Skin is warm, dry and intact. No rash noted.  No petechiae, purpura, or bullae.  ____________________________________________    LABS (pertinent positives/negatives) (all labs ordered are listed, but only abnormal results are displayed) Labs Reviewed  BASIC METABOLIC PANEL - Abnormal; Notable for the following:       Result Value   Chloride 92 (*)    CO2 38 (*)    Glucose, Bld 122 (*)    BUN 35 (*)    GFR calc non Af Amer  50 (*)    GFR calc Af Amer 58 (*)    All other components within normal limits  CBC - Abnormal; Notable for the following:    RBC 3.55 (*)    Hemoglobin 10.6 (*)    HCT 32.1 (*)    All other components within normal limits  URINALYSIS, COMPLETE (UACMP) WITH MICROSCOPIC - Abnormal; Notable for the following:    Color, Urine YELLOW (*)    APPearance CLEAR (*)    Squamous Epithelial / LPF 0-5 (*)    All other components within normal limits  CBG MONITORING, ED   ____________________________________________   EKG  Interpreted by me Sinus rhythm rate of 89, normal axis and intervals. Normal QRS ST segments and T waves. No acute  ischemic changes  ____________________________________________    RADIOLOGY  Ct Head Wo Contrast  Result Date: 03/02/2017 CLINICAL DATA:  Multiple falls. Right-sided hematoma. Initial encounter. EXAM: CT HEAD WITHOUT CONTRAST CT MAXILLOFACIAL WITHOUT CONTRAST CT CERVICAL SPINE WITHOUT CONTRAST TECHNIQUE: Multidetector CT imaging of the head, cervical spine, and maxillofacial structures were performed using the standard protocol without intravenous contrast. Multiplanar CT image reconstructions of the cervical spine and maxillofacial structures were also generated. COMPARISON:  Head CT 05/20/2012.  Neck CT 05/21/2012. FINDINGS: CT HEAD FINDINGS Brain: There is no evidence of acute cortical infarct, intracranial hemorrhage, mass, midline shift, or extra-axial fluid collection. Moderate cerebral atrophy is unchanged. Confluent cerebral white matter hypodensities are also unchanged and nonspecific but compatible with extensive chronic small vessel ischemic disease. Vascular: Calcified atherosclerosis at the skullbase. No hyperdense vessel. Skull: No fracture or focal osseous lesion. Other: Right frontotemporal scalp hematoma. CT MAXILLOFACIAL FINDINGS Osseous: No acute fracture is identified. There is asymmetric anterior translation of the right temporomandibular joint.  Orbits: Bilateral cataract extraction.  No acute traumatic finding. Sinuses: Mild left maxillary sinus mucosal thickening. No sinus fluid levels. Clear mastoid air cells and middle ear cavities bilaterally. Soft tissues: Partially visualize right frontotemporal scalp hematoma. CT CERVICAL SPINE FINDINGS Alignment: Grade 1 anterolisthesis of C2 on C3, C4 on C5, C5 on C6, and C7 on T1 and grade 1 retrolisthesis of C3 on C4, most likely all degenerative. No perched facets. Skull base and vertebrae: No acute fracture or suspicious osseous lesion identified. Soft tissues and spinal canal: No prevertebral fluid or swelling. No visible canal hematoma. Disc levels: C1-2 degenerative changes with mild to moderate ligamentous thickening and calcification posterior to the dens. Diffuse cervical disc degeneration, most advanced at C3-4 and C6-7. Advanced diffuse cervical facet arthrosis. Multilevel neural foraminal stenosis, severe on the left at C3-4. Upper chest: Advanced emphysema in the lung apices. Other: Calcific atherosclerosis involving both carotid bifurcations as well as the visualized subclavian arteries. IMPRESSION: 1. No acute intracranial abnormality identified. 2. Moderate cerebral atrophy and extensive chronic small vessel ischemic disease. 3. Right frontotemporal scalp hematoma. 4. No acute cervical spine or maxillofacial fracture identified. 5. Advanced cervical disc and facet degeneration. Multilevel listhesis, likely degenerative. Electronically Signed   By: Sebastian Ache M.D.   On: 03/02/2017 17:08   Ct Cervical Spine Wo Contrast  Result Date: 03/02/2017 CLINICAL DATA:  Multiple falls. Right-sided hematoma. Initial encounter. EXAM: CT HEAD WITHOUT CONTRAST CT MAXILLOFACIAL WITHOUT CONTRAST CT CERVICAL SPINE WITHOUT CONTRAST TECHNIQUE: Multidetector CT imaging of the head, cervical spine, and maxillofacial structures were performed using the standard protocol without intravenous contrast. Multiplanar CT  image reconstructions of the cervical spine and maxillofacial structures were also generated. COMPARISON:  Head CT 05/20/2012.  Neck CT 05/21/2012. FINDINGS: CT HEAD FINDINGS Brain: There is no evidence of acute cortical infarct, intracranial hemorrhage, mass, midline shift, or extra-axial fluid collection. Moderate cerebral atrophy is unchanged. Confluent cerebral white matter hypodensities are also unchanged and nonspecific but compatible with extensive chronic small vessel ischemic disease. Vascular: Calcified atherosclerosis at the skullbase. No hyperdense vessel. Skull: No fracture or focal osseous lesion. Other: Right frontotemporal scalp hematoma. CT MAXILLOFACIAL FINDINGS Osseous: No acute fracture is identified. There is asymmetric anterior translation of the right temporomandibular joint. Orbits: Bilateral cataract extraction.  No acute traumatic finding. Sinuses: Mild left maxillary sinus mucosal thickening. No sinus fluid levels. Clear mastoid air cells and middle ear cavities bilaterally. Soft tissues: Partially visualize right frontotemporal scalp hematoma. CT CERVICAL SPINE FINDINGS Alignment: Grade  1 anterolisthesis of C2 on C3, C4 on C5, C5 on C6, and C7 on T1 and grade 1 retrolisthesis of C3 on C4, most likely all degenerative. No perched facets. Skull base and vertebrae: No acute fracture or suspicious osseous lesion identified. Soft tissues and spinal canal: No prevertebral fluid or swelling. No visible canal hematoma. Disc levels: C1-2 degenerative changes with mild to moderate ligamentous thickening and calcification posterior to the dens. Diffuse cervical disc degeneration, most advanced at C3-4 and C6-7. Advanced diffuse cervical facet arthrosis. Multilevel neural foraminal stenosis, severe on the left at C3-4. Upper chest: Advanced emphysema in the lung apices. Other: Calcific atherosclerosis involving both carotid bifurcations as well as the visualized subclavian arteries. IMPRESSION: 1. No  acute intracranial abnormality identified. 2. Moderate cerebral atrophy and extensive chronic small vessel ischemic disease. 3. Right frontotemporal scalp hematoma. 4. No acute cervical spine or maxillofacial fracture identified. 5. Advanced cervical disc and facet degeneration. Multilevel listhesis, likely degenerative. Electronically Signed   By: Sebastian Ache M.D.   On: 03/02/2017 17:08   Dg Hand Complete Right  Result Date: 03/02/2017 CLINICAL DATA:  Multiple bruises on the back of the right hand. History of multiple falls. EXAM: RIGHT HAND - COMPLETE 3+ VIEW COMPARISON:  None in PACs FINDINGS: The bones are diffusely osteopenic. There is an impacted fracture of the distal left radial metaphysis which appears acute. No definite distal ulnar fracture is observed. There severe joint space loss of the articulation of the distal pole of the scaphoid with the trapezium and trapezoid. The other intercarpal joint spaces are reasonably well-maintained. The [facility] joint spaces are reasonably well-maintained. There is moderate to severe loss of the joint space of the first and second MCP joints. There is severe interphalangeal joint space loss of the DIP joint of the index finger and of the PIP and DIP joints of the third through fifth fingers. There is gull winging as well as subluxation. The soft tissues exhibit no acute abnormality. 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 Milder first and second MCP joint osteoarthritic changes. No acute phalangeal fractures. There is widening of the DIP joint of the index finger. There is lateral subluxation of the middle phalanx at the PIP joint of the third or long finger. Mild degenerative changes of the intercarpal joints as described. Probable acute impacted fracture of the distal right radial metaphysis. Electronically Signed   By: David  [place] M.D.   On: 03/02/2017 16:37   Dg Hip Unilat W Or Wo Pelvis 2-3 Views Right  Result  Date: 03/02/2017 CLINICAL DATA:  Multiple falls.  Pain. EXAM: DG HIP (WITH OR WITHOUT PELVIS) 2-3V RIGHT COMPARISON:  None. FINDINGS: There is no evidence of hip fracture or dislocation. There is no evidence of arthropathy or other focal bone abnormality. Vascular calcification. IMPRESSION: Negative. Electronically Signed   By: Elsie Stain M.D.   On: 03/02/2017 16:28   Ct Maxillofacial Wo Contrast  Result Date: 03/02/2017 CLINICAL DATA:  Multiple falls. Right-sided hematoma. Initial encounter. EXAM: CT HEAD WITHOUT CONTRAST CT MAXILLOFACIAL WITHOUT CONTRAST CT CERVICAL SPINE WITHOUT CONTRAST TECHNIQUE: Multidetector CT imaging of the head, cervical spine, and maxillofacial structures were performed using the standard protocol without intravenous contrast. Multiplanar CT image reconstructions of the cervical spine and maxillofacial structures were also generated. COMPARISON:  Head CT 05/20/2012.  Neck CT 05/21/2012. FINDINGS: CT HEAD FINDINGS Brain: There is no evidence of acute cortical infarct, intracranial hemorrhage, mass, midline shift, or extra-axial fluid collection. Moderate cerebral atrophy  is unchanged. Confluent cerebral white matter hypodensities are also unchanged and nonspecific but compatible with extensive chronic small vessel ischemic disease. Vascular: Calcified atherosclerosis at the skullbase. No hyperdense vessel. Skull: No fracture or focal osseous lesion. Other: Right frontotemporal scalp hematoma. CT MAXILLOFACIAL FINDINGS Osseous: No acute fracture is identified. There is asymmetric anterior translation of the right temporomandibular joint. Orbits: Bilateral cataract extraction.  No acute traumatic finding. Sinuses: Mild left maxillary sinus mucosal thickening. No sinus fluid levels. Clear mastoid air cells and middle ear cavities bilaterally. Soft tissues: Partially visualize right frontotemporal scalp hematoma. CT CERVICAL SPINE FINDINGS Alignment: Grade 1 anterolisthesis of C2 on C3,  C4 on C5, C5 on C6, and C7 on T1 and grade 1 retrolisthesis of C3 on C4, most likely all degenerative. No perched facets. Skull base and vertebrae: No acute fracture or suspicious osseous lesion identified. Soft tissues and spinal canal: No prevertebral fluid or swelling. No visible canal hematoma. Disc levels: C1-2 degenerative changes with mild to moderate ligamentous thickening and calcification posterior to the dens. Diffuse cervical disc degeneration, most advanced at C3-4 and C6-7. Advanced diffuse cervical facet arthrosis. Multilevel neural foraminal stenosis, severe on the left at C3-4. Upper chest: Advanced emphysema in the lung apices. Other: Calcific atherosclerosis involving both carotid bifurcations as well as the visualized subclavian arteries. IMPRESSION: 1. No acute intracranial abnormality identified. 2. Moderate cerebral atrophy and extensive chronic small vessel ischemic disease. 3. Right frontotemporal scalp hematoma. 4. No acute cervical spine or maxillofacial fracture identified. 5. Advanced cervical disc and facet degeneration. Multilevel listhesis, likely degenerative. Electronically Signed   By: Sebastian Ache M.D.   On: 03/02/2017 17:08    ____________________________________________   PROCEDURES Procedures SPLINT APPLICATION Date/Time: 7:28 PM Authorized by: Sharman Cheek Consent: Verbal consent obtained. Risks and benefits: risks, benefits and alternatives were discussed Consent given by: patient Splint applied by: orthopedic technician Location details: Right forearm  Splint type: Thumb spica  Supplies used: Ortho-Glass  Post-procedure: The splinted body part was neurovascularly unchanged following the procedure. Patient tolerance: Patient tolerated the procedure well with no immediate complications.    ____________________________________________   INITIAL IMPRESSION / ASSESSMENT AND PLAN / ED COURSE  Pertinent labs & imaging results that were available  during my care of the patient were reviewed by me and considered in my medical decision making (see chart for details).  Patient presents with multiple contusions and ecchymoses after falls. Chronic gait instability. We'll get CT head maxillofacial C-spine, x-ray hand and right hip.     Clinical Course as of Mar 02 1912  Tue Mar 02, 2017  1725 Possible distal R radius fx, nondisplaced. Will place in thumb spica, f/u ortho. Other imaging = NAD. Plan to DC home.  DG Hand Complete Right [PS]    Clinical Course User Index [PS] Sharman Cheek, MD     ----------------------------------------- 7:29 PM on 03/02/2017 -----------------------------------------  Workup negative except for possible nondisplaced fracture of distal radius. Splinted for initial fracture management. Patient and family informed, follow up with orthopedics. Updated on all the results. Follow-up with primary care. No evidence of sepsis acute infection or acute neurologic or cardiopulmonary pathology.  ____________________________________________   FINAL CLINICAL IMPRESSION(S) / ED DIAGNOSES  Final diagnoses:  Closed torus fracture of distal end of right radius, initial encounter  Contusion of face, initial encounter  Fall in home, initial encounter      New Prescriptions   No medications on file     Portions of this note were generated with  Scientist, clinical (histocompatibility and immunogenetics). Dictation errors may occur despite best attempts at proofreading.    Sharman Cheek, MD 03/02/17 [phone]

## 2017-03-02 NOTE — ED Notes (Addendum)
Spoke w pt about potential transport back to nursing facility.  Family hesitant about taking pt back in POV.  Explained to family while EMS could transport pt back to facility, it would take time (potentially 4 hours) for EMS truck to become available. Pts family verbalized understanding

## 2017-03-04 NOTE — Progress Notes (Signed)
Location:      Place of Service:  SNF (31) Provider:  Lorenso Quarry, NP-C  Lauro Regulus., MD  Patient Care Team: Lauro Regulus, MD as PCP - General (Internal Medicine)  Extended Emergency Contact Information Primary Emergency Contact: August Luz Address: [address]          [place], [place] [address] Darden [place] of [place] Home Phone: [phone] Relation: Daughter Secondary Emergency Contact: Thompson,Kathryn Address: [address]          [place], [place] [address] [place] of [place] Home Phone: [phone] Relation: Daughter  Code Status:  DNR Goals of care: Advanced Directive information No flowsheet data found.   Chief Complaint  Patient presents with  . Acute Visit    HPI:  Pt is a 83 y.o. female seen today for an acute visit for dyspnea, low sats, wheezes. Pt has has a chronic, congested sounding cough per staff. She has began to have tachycardia and tachypnea. Pt reports she feels "terrible." Staff has been giving her guaifenesin/ guaifenesin-DM without relief. Pt does report difficulty breathing. Denies chest pain. Denies n/v/d/ha/abd pain/dizziness. No fever. Coarse sounding cough during exam. Pt is pale. Generally looks ill. Pt is also experiencing increased anxiety, exacerbating the dyspnea. Will encourage staff to use the Roxanol for dyspnea control. Otherwise, VSS. No other complaints. Family here to visit.    Past Medical History:  Diagnosis Date  . COPD (chronic obstructive pulmonary disease) (HCC)    on home oxygen  . Hypertension   . Rheumatoid arthritis (HCC)    No past surgical history on file.  Allergies  Allergen Reactions  . Ace Inhibitors Hives  . Penicillins Rash    Allergies as of 03/01/2017      Reactions   Ace Inhibitors Hives   Penicillins Rash      Medication List       Accurate as of 03/01/17 11:59 PM. Always use your most recent med list.          ADVAIR DISKUS 100-50 MCG/DOSE Aepb Generic drug:   Fluticasone-Salmeterol daily.   amLODipine 5 MG tablet Commonly known as:  NORVASC Take 5 mg by mouth daily.   aspirin 81 MG tablet Take by mouth every other day.   citalopram 10 MG tablet Commonly known as:  CELEXA Take 10 mg by mouth daily.   furosemide 20 MG tablet Commonly known as:  LASIX Take 20 mg by mouth every other day.   isosorbide mononitrate 30 MG 24 hr tablet Commonly known as:  IMDUR Take 1 tablet (30 mg total) by mouth daily.   losartan 25 MG tablet Commonly known as:  COZAAR Take 25 mg by mouth daily.   metoprolol tartrate 25 MG tablet Commonly known as:  LOPRESSOR Take 25 mg by mouth daily.   mirtazapine 15 MG tablet Commonly known as:  REMERON Take 15 mg by mouth daily.   NITROSTAT 0.4 MG SL tablet Generic drug:  nitroGLYCERIN Take 0.4 mg by mouth as needed.   PROAIR HFA 108 (90 Base) MCG/ACT inhaler Generic drug:  albuterol   SPIRIVA HANDIHALER 18 MCG inhalation capsule Generic drug:  tiotropium Take 18 mcg by mouth as needed.   traZODone 100 MG tablet Commonly known as:  DESYREL Take 100 mg by mouth daily.   VYTORIN 10-20 MG tablet Generic drug:  ezetimibe-simvastatin daily.       Review of Systems  Constitutional: Positive for activity change, appetite change and fatigue. Negative for chills, diaphoresis and fever.  HENT:  Positive for congestion. Negative for sneezing, sore throat, trouble swallowing and voice change.   Eyes: Negative for pain, redness and visual disturbance.  Respiratory: Positive for cough, chest tightness, shortness of breath and wheezing. Negative for apnea and choking.   Cardiovascular: Negative.  Negative for chest pain, palpitations and leg swelling.  Gastrointestinal: Negative.  Negative for abdominal distention, abdominal pain, constipation, diarrhea and nausea.  Genitourinary: Negative.  Negative for difficulty urinating, dysuria, frequency and urgency.  Musculoskeletal: Negative.  Negative for back  pain, gait problem and myalgias. Arthralgias: typical arthritis.  Skin: Negative for color change, pallor, rash and wound.  Neurological: Negative.  Negative for dizziness, tremors, syncope, speech difficulty, weakness, numbness and headaches.  Psychiatric/Behavioral: Negative.  Negative for agitation and behavioral problems.  All other systems reviewed and are negative.   Immunization History  Administered Date(s) Administered  . Tdap 03/02/2017   Pertinent  Health Maintenance Due  Topic Date Due  . DEXA SCAN  11/10/1992  . PNA vac Low Risk Adult (1 of 2 - PCV13) 11/10/1992  . INFLUENZA VACCINE  06/23/2017   No flowsheet data found. Functional Status Survey:    Vitals:   03/01/17 2320  BP: 109/76  Pulse: 90  Resp: 18  Temp: 97.6 F (36.4 C)  SpO2: 95%   There is no height or weight on file to calculate BMI. Physical Exam  Constitutional: She is oriented to person, place, and time. Vital signs are normal. She appears well-developed and well-nourished. She is active and cooperative. She appears ill. No distress. Nasal cannula in place.  HENT:  Head: Normocephalic and atraumatic.  Mouth/Throat: Uvula is midline, oropharynx is clear and moist and mucous membranes are normal. Mucous membranes are not pale, not dry and not cyanotic.  Eyes: Conjunctivae, EOM and lids are normal. Pupils are equal, round, and reactive to light.  Neck: Trachea normal, normal range of motion and full passive range of motion without pain. Neck supple. No JVD present. No tracheal deviation, no edema and no erythema present. No thyromegaly present.  Cardiovascular: Normal heart sounds, intact distal pulses and normal pulses.  An irregular rhythm present. Tachycardia present.  Exam reveals no gallop, no distant heart sounds and no friction rub.   No murmur heard. Pulmonary/Chest: Effort normal. No accessory muscle usage. No respiratory distress. She has decreased breath sounds in the right lower field and  the left lower field. She has wheezes in the right upper field and the left upper field. She has rhonchi in the right upper field and the left upper field. She has rales in the right upper field and the left upper field. She exhibits no tenderness.  Lung sounds very "tight". tachypnea  Abdominal: Soft. Normal appearance and bowel sounds are normal. She exhibits no distension and no ascites. There is no tenderness.  Musculoskeletal: Normal range of motion. She exhibits no edema or tenderness.  Expected osteoarthritis, stiffness  Neurological: She is alert and oriented to person, place, and time. She has normal strength.  Skin: Skin is warm, dry and intact. She is not diaphoretic. No cyanosis. No pallor. Nails show no clubbing.  Psychiatric: She has a normal mood and affect. Her speech is normal and behavior is normal. Judgment and thought content normal. Cognition and memory are normal.  Nursing note and vitals reviewed.   Labs reviewed:  Recent Labs  01/19/17 1018 03/02/17 1514  NA 137 136  K 3.5 4.2  CL 92* 92*  CO2 40* 38*  GLUCOSE 135* 122*  BUN 17 35*  CREATININE 0.62 0.98  CALCIUM 9.4 9.2  MG 1.6*  --     Recent Labs  01/19/17 1018  AST 29  ALT 13*  ALKPHOS 68  BILITOT 0.3  PROT 7.0  ALBUMIN 3.7    Recent Labs  01/19/17 1018 03/02/17 1514  WBC 7.2 8.1  NEUTROABS 5.3  --   HGB 11.0* 10.6*  HCT 31.9* 32.1*  MCV 87.6 90.4  PLT 267 163   Lab Results  Component Value Date   TSH 2.794 01/19/2017   No results found for: HGBA1C Lab Results  Component Value Date   CHOL 159 01/19/2017   HDL 46 01/19/2017   LDLCALC 81 01/19/2017   TRIG 158 (H) 01/19/2017   CHOLHDL 3.5 01/19/2017    Significant Diagnostic Results in last 30 days:  Ct Head Wo Contrast  Result Date: 03/02/2017 CLINICAL DATA:  Multiple falls. Right-sided hematoma. Initial encounter. EXAM: CT HEAD WITHOUT CONTRAST CT MAXILLOFACIAL WITHOUT CONTRAST CT CERVICAL SPINE WITHOUT CONTRAST TECHNIQUE:  Multidetector CT imaging of the head, cervical spine, and maxillofacial structures were performed using the standard protocol without intravenous contrast. Multiplanar CT image reconstructions of the cervical spine and maxillofacial structures were also generated. COMPARISON:  Head CT 05/20/2012.  Neck CT 05/21/2012. FINDINGS: CT HEAD FINDINGS Brain: There is no evidence of acute cortical infarct, intracranial hemorrhage, mass, midline shift, or extra-axial fluid collection. Moderate cerebral atrophy is unchanged. Confluent cerebral white matter hypodensities are also unchanged and nonspecific but compatible with extensive chronic small vessel ischemic disease. Vascular: Calcified atherosclerosis at the skullbase. No hyperdense vessel. Skull: No fracture or focal osseous lesion. Other: Right frontotemporal scalp hematoma. CT MAXILLOFACIAL FINDINGS Osseous: No acute fracture is identified. There is asymmetric anterior translation of the right temporomandibular joint. Orbits: Bilateral cataract extraction.  No acute traumatic finding. Sinuses: Mild left maxillary sinus mucosal thickening. No sinus fluid levels. Clear mastoid air cells and middle ear cavities bilaterally. Soft tissues: Partially visualize right frontotemporal scalp hematoma. CT CERVICAL SPINE FINDINGS Alignment: Grade 1 anterolisthesis of C2 on C3, C4 on C5, C5 on C6, and C7 on T1 and grade 1 retrolisthesis of C3 on C4, most likely all degenerative. No perched facets. Skull base and vertebrae: No acute fracture or suspicious osseous lesion identified. Soft tissues and spinal canal: No prevertebral fluid or swelling. No visible canal hematoma. Disc levels: C1-2 degenerative changes with mild to moderate ligamentous thickening and calcification posterior to the dens. Diffuse cervical disc degeneration, most advanced at C3-4 and C6-7. Advanced diffuse cervical facet arthrosis. Multilevel neural foraminal stenosis, severe on the left at C3-4. Upper chest:  Advanced emphysema in the lung apices. Other: Calcific atherosclerosis involving both carotid bifurcations as well as the visualized subclavian arteries. IMPRESSION: 1. No acute intracranial abnormality identified. 2. Moderate cerebral atrophy and extensive chronic small vessel ischemic disease. 3. Right frontotemporal scalp hematoma. 4. No acute cervical spine or maxillofacial fracture identified. 5. Advanced cervical disc and facet degeneration. Multilevel listhesis, likely degenerative. Electronically Signed   By: Sebastian Ache M.D.   On: 03/02/2017 17:08   Ct Cervical Spine Wo Contrast  Result Date: 03/02/2017 CLINICAL DATA:  Multiple falls. Right-sided hematoma. Initial encounter. EXAM: CT HEAD WITHOUT CONTRAST CT MAXILLOFACIAL WITHOUT CONTRAST CT CERVICAL SPINE WITHOUT CONTRAST TECHNIQUE: Multidetector CT imaging of the head, cervical spine, and maxillofacial structures were performed using the standard protocol without intravenous contrast. Multiplanar CT image reconstructions of the cervical spine and maxillofacial structures were also generated. COMPARISON:  Head CT 05/20/2012.  Neck CT 05/21/2012. FINDINGS: CT HEAD FINDINGS Brain: There is no evidence of acute cortical infarct, intracranial hemorrhage, mass, midline shift, or extra-axial fluid collection. Moderate cerebral atrophy is unchanged. Confluent cerebral white matter hypodensities are also unchanged and nonspecific but compatible with extensive chronic small vessel ischemic disease. Vascular: Calcified atherosclerosis at the skullbase. No hyperdense vessel. Skull: No fracture or focal osseous lesion. Other: Right frontotemporal scalp hematoma. CT MAXILLOFACIAL FINDINGS Osseous: No acute fracture is identified. There is asymmetric anterior translation of the right temporomandibular joint. Orbits: Bilateral cataract extraction.  No acute traumatic finding. Sinuses: Mild left maxillary sinus mucosal thickening. No sinus fluid levels. Clear mastoid  air cells and middle ear cavities bilaterally. Soft tissues: Partially visualize right frontotemporal scalp hematoma. CT CERVICAL SPINE FINDINGS Alignment: Grade 1 anterolisthesis of C2 on C3, C4 on C5, C5 on C6, and C7 on T1 and grade 1 retrolisthesis of C3 on C4, most likely all degenerative. No perched facets. Skull base and vertebrae: No acute fracture or suspicious osseous lesion identified. Soft tissues and spinal canal: No prevertebral fluid or swelling. No visible canal hematoma. Disc levels: C1-2 degenerative changes with mild to moderate ligamentous thickening and calcification posterior to the dens. Diffuse cervical disc degeneration, most advanced at C3-4 and C6-7. Advanced diffuse cervical facet arthrosis. Multilevel neural foraminal stenosis, severe on the left at C3-4. Upper chest: Advanced emphysema in the lung apices. Other: Calcific atherosclerosis involving both carotid bifurcations as well as the visualized subclavian arteries. IMPRESSION: 1. No acute intracranial abnormality identified. 2. Moderate cerebral atrophy and extensive chronic small vessel ischemic disease. 3. Right frontotemporal scalp hematoma. 4. No acute cervical spine or maxillofacial fracture identified. 5. Advanced cervical disc and facet degeneration. Multilevel listhesis, likely degenerative. Electronically Signed   By: Sebastian Ache M.D.   On: 03/02/2017 17:08   Dg Hand Complete Right  Result Date: 03/02/2017 CLINICAL DATA:  Multiple bruises on the back of the right hand. History of multiple falls. EXAM: RIGHT HAND - COMPLETE 3+ VIEW COMPARISON:  None in PACs FINDINGS: The bones are diffusely osteopenic. There is an impacted fracture of the distal left radial metaphysis which appears acute. No definite distal ulnar fracture is observed. There severe joint space loss of the articulation of the distal pole of the scaphoid with the trapezium and trapezoid. The other intercarpal joint spaces are reasonably well-maintained. The  [facility] joint spaces are reasonably well-maintained. There is moderate to severe loss of the joint space of the first and second MCP joints. There is severe interphalangeal joint space loss of the DIP joint of the index finger and of the PIP and DIP joints of the third through fifth fingers. There is gull winging as well as subluxation. The soft tissues exhibit no acute abnormality. 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 Milder first and second MCP joint osteoarthritic changes. No acute phalangeal fractures. There is widening of the DIP joint of the index finger. There is lateral subluxation of the middle phalanx at the PIP joint of the third or long finger. Mild degenerative changes of the intercarpal joints as described. Probable acute impacted fracture of the distal right radial metaphysis. Electronically Signed   By: David  [place] M.D.   On: 03/02/2017 16:37   Dg Hip Unilat W Or Wo Pelvis 2-3 Views Right  Result Date: 03/02/2017 CLINICAL DATA:  Multiple falls.  Pain. EXAM: DG HIP (WITH OR WITHOUT PELVIS) 2-3V RIGHT COMPARISON:  None. FINDINGS: There is no evidence of hip fracture or  dislocation. There is no evidence of arthropathy or other focal bone abnormality. Vascular calcification. IMPRESSION: Negative. Electronically Signed   By: Elsie Stain M.D.   On: 03/02/2017 16:28   Ct Maxillofacial Wo Contrast  Result Date: 03/02/2017 CLINICAL DATA:  Multiple falls. Right-sided hematoma. Initial encounter. EXAM: CT HEAD WITHOUT CONTRAST CT MAXILLOFACIAL WITHOUT CONTRAST CT CERVICAL SPINE WITHOUT CONTRAST TECHNIQUE: Multidetector CT imaging of the head, cervical spine, and maxillofacial structures were performed using the standard protocol without intravenous contrast. Multiplanar CT image reconstructions of the cervical spine and maxillofacial structures were also generated. COMPARISON:  Head CT 05/20/2012.  Neck CT 05/21/2012. FINDINGS: CT HEAD FINDINGS Brain:  There is no evidence of acute cortical infarct, intracranial hemorrhage, mass, midline shift, or extra-axial fluid collection. Moderate cerebral atrophy is unchanged. Confluent cerebral white matter hypodensities are also unchanged and nonspecific but compatible with extensive chronic small vessel ischemic disease. Vascular: Calcified atherosclerosis at the skullbase. No hyperdense vessel. Skull: No fracture or focal osseous lesion. Other: Right frontotemporal scalp hematoma. CT MAXILLOFACIAL FINDINGS Osseous: No acute fracture is identified. There is asymmetric anterior translation of the right temporomandibular joint. Orbits: Bilateral cataract extraction.  No acute traumatic finding. Sinuses: Mild left maxillary sinus mucosal thickening. No sinus fluid levels. Clear mastoid air cells and middle ear cavities bilaterally. Soft tissues: Partially visualize right frontotemporal scalp hematoma. CT CERVICAL SPINE FINDINGS Alignment: Grade 1 anterolisthesis of C2 on C3, C4 on C5, C5 on C6, and C7 on T1 and grade 1 retrolisthesis of C3 on C4, most likely all degenerative. No perched facets. Skull base and vertebrae: No acute fracture or suspicious osseous lesion identified. Soft tissues and spinal canal: No prevertebral fluid or swelling. No visible canal hematoma. Disc levels: C1-2 degenerative changes with mild to moderate ligamentous thickening and calcification posterior to the dens. Diffuse cervical disc degeneration, most advanced at C3-4 and C6-7. Advanced diffuse cervical facet arthrosis. Multilevel neural foraminal stenosis, severe on the left at C3-4. Upper chest: Advanced emphysema in the lung apices. Other: Calcific atherosclerosis involving both carotid bifurcations as well as the visualized subclavian arteries. IMPRESSION: 1. No acute intracranial abnormality identified. 2. Moderate cerebral atrophy and extensive chronic small vessel ischemic disease. 3. Right frontotemporal scalp hematoma. 4. No acute  cervical spine or maxillofacial fracture identified. 5. Advanced cervical disc and facet degeneration. Multilevel listhesis, likely degenerative. Electronically Signed   By: Sebastian Ache M.D.   On: 03/02/2017 17:08    Assessment/Plan 1. Chronic obstructive pulmonary disease, unspecified COPD type (HCC)  Solumedrol 125 mg IM x 1 now (at 1030 this am)  Duo-neb TID scheduled  Morphine 20 mg/ ml- 0.25-0.5 mL po/SL Q 1 hour prn pain, dyspnea, anxiety   Family/ staff Communication:   Total Time:  Documentation:  Face to Face:  Family/Phone:   Labs/tests ordered:    Medication list reviewed and assessed for continued appropriateness.  Brynda Rim, NP-C Geriatrics [facility] [phone] [address], [place] [address] Cell Phone (Mon-Fri 8am-5pm):  [phone] On Call:  [phone] & follow prompts after 5pm & weekends Office Phone:  [phone] Office Fax:  [phone]

## 2017-03-04 NOTE — Progress Notes (Signed)
Location:      Place of Service:  SNF (31) Provider:  Lorenso Quarry, NP-C  Lauro Regulus., MD  Patient Care Team: Lauro Regulus, MD as PCP - General (Internal Medicine)  Extended Emergency Contact Information Primary Emergency Contact: August Luz Address: [address]          [place], [place] [address] Darden Amber of [place] Home Phone: [phone] Relation: Daughter Secondary Emergency Contact: Thompson,Kathryn Address: [address]          [place], [place] [address] Darden [place] of [place] Home Phone: [phone] Relation: Daughter  Code Status:  DNR Goals of care: Advanced Directive information No flowsheet data found.   Chief Complaint  Patient presents with  . Medical Management of Chronic Issues    HPI:  Pt is a 83 y.o. female seen today for medical management of chronic diseases. Pt remains under Hospice care. Sx of COPD and HTN have been stable. Pt has gotten up unassisted and fallen several times as of late. There have been no injuries. However, pt's COPD exacerbations have started coming more frequently. Today, pt reports she is feeling fine. Pt denies n/v/d/f/c/cp/sob/ha/abd pain/dizziness, etc. VSS. No other complaints.    Past Medical History:  Diagnosis Date  . COPD (chronic obstructive pulmonary disease) (HCC)    on home oxygen  . Hypertension   . Rheumatoid arthritis (HCC)    No past surgical history on file.  Allergies  Allergen Reactions  . Ace Inhibitors Hives  . Penicillins Rash    Allergies as of 02/24/2017      Reactions   Ace Inhibitors Hives   Penicillins Rash      Medication List       Accurate as of 02/24/17 11:59 PM. Always use your most recent med list.          ADVAIR DISKUS 100-50 MCG/DOSE Aepb Generic drug:  Fluticasone-Salmeterol daily.   amLODipine 5 MG tablet Commonly known as:  NORVASC Take 5 mg by mouth daily.   aspirin 81 MG tablet Take by mouth every other day.   citalopram 10 MG  tablet Commonly known as:  CELEXA Take 10 mg by mouth daily.   furosemide 20 MG tablet Commonly known as:  LASIX Take 20 mg by mouth every other day.   isosorbide mononitrate 30 MG 24 hr tablet Commonly known as:  IMDUR Take 1 tablet (30 mg total) by mouth daily.   losartan 25 MG tablet Commonly known as:  COZAAR Take 25 mg by mouth daily.   metoprolol tartrate 25 MG tablet Commonly known as:  LOPRESSOR Take 25 mg by mouth daily.   mirtazapine 15 MG tablet Commonly known as:  REMERON Take 15 mg by mouth daily.   NITROSTAT 0.4 MG SL tablet Generic drug:  nitroGLYCERIN Take 0.4 mg by mouth as needed.   PROAIR HFA 108 (90 Base) MCG/ACT inhaler Generic drug:  albuterol   SPIRIVA HANDIHALER 18 MCG inhalation capsule Generic drug:  tiotropium Take 18 mcg by mouth as needed.   traZODone 100 MG tablet Commonly known as:  DESYREL Take 100 mg by mouth daily.   VYTORIN 10-20 MG tablet Generic drug:  ezetimibe-simvastatin daily.       Review of Systems  Constitutional: Positive for activity change, appetite change and fatigue. Negative for chills, diaphoresis and fever.  HENT: Negative for congestion, sneezing, sore throat, trouble swallowing and voice change.   Eyes: Negative for pain, redness and visual disturbance.  Respiratory: Negative for apnea, cough, choking, chest  tightness, shortness of breath and wheezing.   Cardiovascular: Negative.  Negative for chest pain, palpitations and leg swelling.  Gastrointestinal: Negative.  Negative for abdominal distention, abdominal pain, constipation, diarrhea and nausea.  Genitourinary: Negative.  Negative for difficulty urinating, dysuria, frequency and urgency.  Musculoskeletal: Negative.  Negative for back pain, gait problem and myalgias. Arthralgias: typical arthritis.  Skin: Negative for color change, pallor, rash and wound.  Neurological: Negative.  Negative for dizziness, tremors, syncope, speech difficulty, weakness,  numbness and headaches.  Psychiatric/Behavioral: Negative.  Negative for agitation and behavioral problems.  All other systems reviewed and are negative.   Immunization History  Administered Date(s) Administered  . Tdap 03/02/2017   Pertinent  Health Maintenance Due  Topic Date Due  . DEXA SCAN  11/10/1992  . PNA vac Low Risk Adult (1 of 2 - PCV13) 11/10/1992  . INFLUENZA VACCINE  06/23/2017   No flowsheet data found. Functional Status Survey:    Vitals:   02/23/17 2100  BP: 114/68  Pulse: 85  Resp: (!) 22  Temp: 97.2 F (36.2 C)  SpO2: 94%   There is no height or weight on file to calculate BMI. Physical Exam  Constitutional: She is oriented to person, place, and time. Vital signs are normal. She appears well-developed and well-nourished. She is active and cooperative. She appears ill. No distress. Nasal cannula in place.  HENT:  Head: Normocephalic and atraumatic.  Mouth/Throat: Uvula is midline, oropharynx is clear and moist and mucous membranes are normal. Mucous membranes are not pale, not dry and not cyanotic.  Eyes: Conjunctivae, EOM and lids are normal. Pupils are equal, round, and reactive to light.  Neck: Trachea normal, normal range of motion and full passive range of motion without pain. Neck supple. No JVD present. No tracheal deviation, no edema and no erythema present. No thyromegaly present.  Cardiovascular: Normal heart sounds, intact distal pulses and normal pulses.  An irregular rhythm present. Tachycardia present.  Exam reveals no gallop, no distant heart sounds and no friction rub.   No murmur heard. Pulmonary/Chest: Effort normal. No accessory muscle usage. No respiratory distress. She has no decreased breath sounds. She has wheezes in the right upper field and the left upper field. She has no rhonchi. She has no rales. She exhibits no tenderness.  Lung sounds very "tight". tachypnea  Abdominal: Soft. Normal appearance and bowel sounds are normal. She  exhibits no distension and no ascites. There is no tenderness.  Musculoskeletal: Normal range of motion. She exhibits no edema or tenderness.  Expected osteoarthritis, stiffness  Neurological: She is alert and oriented to person, place, and time. She has normal strength.  Skin: Skin is warm, dry and intact. She is not diaphoretic. No cyanosis. No pallor. Nails show no clubbing.  Psychiatric: She has a normal mood and affect. Her speech is normal and behavior is normal. Judgment and thought content normal. Cognition and memory are normal.  Nursing note and vitals reviewed.   Labs reviewed:  Recent Labs  01/19/17 1018 03/02/17 1514  NA 137 136  K 3.5 4.2  CL 92* 92*  CO2 40* 38*  GLUCOSE 135* 122*  BUN 17 35*  CREATININE 0.62 0.98  CALCIUM 9.4 9.2  MG 1.6*  --     Recent Labs  01/19/17 1018  AST 29  ALT 13*  ALKPHOS 68  BILITOT 0.3  PROT 7.0  ALBUMIN 3.7    Recent Labs  01/19/17 1018 03/02/17 1514  WBC 7.2 8.1  NEUTROABS 5.3  --  HGB 11.0* 10.6*  HCT 31.9* 32.1*  MCV 87.6 90.4  PLT 267 163   Lab Results  Component Value Date   TSH 2.794 01/19/2017   No results found for: HGBA1C Lab Results  Component Value Date   CHOL 159 01/19/2017   HDL 46 01/19/2017   LDLCALC 81 01/19/2017   TRIG 158 (H) 01/19/2017   CHOLHDL 3.5 01/19/2017    Significant Diagnostic Results in last 30 days:  Ct Head Wo Contrast  Result Date: 03/02/2017 CLINICAL DATA:  Multiple falls. Right-sided hematoma. Initial encounter. EXAM: CT HEAD WITHOUT CONTRAST CT MAXILLOFACIAL WITHOUT CONTRAST CT CERVICAL SPINE WITHOUT CONTRAST TECHNIQUE: Multidetector CT imaging of the head, cervical spine, and maxillofacial structures were performed using the standard protocol without intravenous contrast. Multiplanar CT image reconstructions of the cervical spine and maxillofacial structures were also generated. COMPARISON:  Head CT 05/20/2012.  Neck CT 05/21/2012. FINDINGS: CT HEAD FINDINGS Brain: There  is no evidence of acute cortical infarct, intracranial hemorrhage, mass, midline shift, or extra-axial fluid collection. Moderate cerebral atrophy is unchanged. Confluent cerebral white matter hypodensities are also unchanged and nonspecific but compatible with extensive chronic small vessel ischemic disease. Vascular: Calcified atherosclerosis at the skullbase. No hyperdense vessel. Skull: No fracture or focal osseous lesion. Other: Right frontotemporal scalp hematoma. CT MAXILLOFACIAL FINDINGS Osseous: No acute fracture is identified. There is asymmetric anterior translation of the right temporomandibular joint. Orbits: Bilateral cataract extraction.  No acute traumatic finding. Sinuses: Mild left maxillary sinus mucosal thickening. No sinus fluid levels. Clear mastoid air cells and middle ear cavities bilaterally. Soft tissues: Partially visualize right frontotemporal scalp hematoma. CT CERVICAL SPINE FINDINGS Alignment: Grade 1 anterolisthesis of C2 on C3, C4 on C5, C5 on C6, and C7 on T1 and grade 1 retrolisthesis of C3 on C4, most likely all degenerative. No perched facets. Skull base and vertebrae: No acute fracture or suspicious osseous lesion identified. Soft tissues and spinal canal: No prevertebral fluid or swelling. No visible canal hematoma. Disc levels: C1-2 degenerative changes with mild to moderate ligamentous thickening and calcification posterior to the dens. Diffuse cervical disc degeneration, most advanced at C3-4 and C6-7. Advanced diffuse cervical facet arthrosis. Multilevel neural foraminal stenosis, severe on the left at C3-4. Upper chest: Advanced emphysema in the lung apices. Other: Calcific atherosclerosis involving both carotid bifurcations as well as the visualized subclavian arteries. IMPRESSION: 1. No acute intracranial abnormality identified. 2. Moderate cerebral atrophy and extensive chronic small vessel ischemic disease. 3. Right frontotemporal scalp hematoma. 4. No acute cervical  spine or maxillofacial fracture identified. 5. Advanced cervical disc and facet degeneration. Multilevel listhesis, likely degenerative. Electronically Signed   By: Sebastian Ache M.D.   On: 03/02/2017 17:08   Ct Cervical Spine Wo Contrast  Result Date: 03/02/2017 CLINICAL DATA:  Multiple falls. Right-sided hematoma. Initial encounter. EXAM: CT HEAD WITHOUT CONTRAST CT MAXILLOFACIAL WITHOUT CONTRAST CT CERVICAL SPINE WITHOUT CONTRAST TECHNIQUE: Multidetector CT imaging of the head, cervical spine, and maxillofacial structures were performed using the standard protocol without intravenous contrast. Multiplanar CT image reconstructions of the cervical spine and maxillofacial structures were also generated. COMPARISON:  Head CT 05/20/2012.  Neck CT 05/21/2012. FINDINGS: CT HEAD FINDINGS Brain: There is no evidence of acute cortical infarct, intracranial hemorrhage, mass, midline shift, or extra-axial fluid collection. Moderate cerebral atrophy is unchanged. Confluent cerebral white matter hypodensities are also unchanged and nonspecific but compatible with extensive chronic small vessel ischemic disease. Vascular: Calcified atherosclerosis at the skullbase. No hyperdense vessel. Skull: No fracture or focal  osseous lesion. Other: Right frontotemporal scalp hematoma. CT MAXILLOFACIAL FINDINGS Osseous: No acute fracture is identified. There is asymmetric anterior translation of the right temporomandibular joint. Orbits: Bilateral cataract extraction.  No acute traumatic finding. Sinuses: Mild left maxillary sinus mucosal thickening. No sinus fluid levels. Clear mastoid air cells and middle ear cavities bilaterally. Soft tissues: Partially visualize right frontotemporal scalp hematoma. CT CERVICAL SPINE FINDINGS Alignment: Grade 1 anterolisthesis of C2 on C3, C4 on C5, C5 on C6, and C7 on T1 and grade 1 retrolisthesis of C3 on C4, most likely all degenerative. No perched facets. Skull base and vertebrae: No acute  fracture or suspicious osseous lesion identified. Soft tissues and spinal canal: No prevertebral fluid or swelling. No visible canal hematoma. Disc levels: C1-2 degenerative changes with mild to moderate ligamentous thickening and calcification posterior to the dens. Diffuse cervical disc degeneration, most advanced at C3-4 and C6-7. Advanced diffuse cervical facet arthrosis. Multilevel neural foraminal stenosis, severe on the left at C3-4. Upper chest: Advanced emphysema in the lung apices. Other: Calcific atherosclerosis involving both carotid bifurcations as well as the visualized subclavian arteries. IMPRESSION: 1. No acute intracranial abnormality identified. 2. Moderate cerebral atrophy and extensive chronic small vessel ischemic disease. 3. Right frontotemporal scalp hematoma. 4. No acute cervical spine or maxillofacial fracture identified. 5. Advanced cervical disc and facet degeneration. Multilevel listhesis, likely degenerative. Electronically Signed   By: Sebastian Ache M.D.   On: 03/02/2017 17:08   Dg Hand Complete Right  Result Date: 03/02/2017 CLINICAL DATA:  Multiple bruises on the back of the right hand. History of multiple falls. EXAM: RIGHT HAND - COMPLETE 3+ VIEW COMPARISON:  None in PACs FINDINGS: The bones are diffusely osteopenic. There is an impacted fracture of the distal left radial metaphysis which appears acute. No definite distal ulnar fracture is observed. There severe joint space loss of the articulation of the distal pole of the scaphoid with the trapezium and trapezoid. The other intercarpal joint spaces are reasonably well-maintained. [facility] joint spaces are reasonably well-maintained. There is moderate to severe loss of the joint space of the first and second MCP joints. There is severe interphalangeal joint space loss of the DIP joint of the index finger and of the PIP and DIP joints of the third through fifth fingers. There is gull winging as well as subluxation. The soft  tissues exhibit no acute abnormality. 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 Milder first and second MCP joint osteoarthritic changes. No acute phalangeal fractures. There is widening of the DIP joint of the index finger. There is lateral subluxation of the middle phalanx at the PIP joint of the third or long finger. Mild degenerative changes of the intercarpal joints as described. Probable acute impacted fracture of the distal right radial metaphysis. Electronically Signed   By: David  [place] M.D.   On: 03/02/2017 16:37   Dg Hip Unilat W Or Wo Pelvis 2-3 Views Right  Result Date: 03/02/2017 CLINICAL DATA:  Multiple falls.  Pain. EXAM: DG HIP (WITH OR WITHOUT PELVIS) 2-3V RIGHT COMPARISON:  None. FINDINGS: There is no evidence of hip fracture or dislocation. There is no evidence of arthropathy or other focal bone abnormality. Vascular calcification. IMPRESSION: Negative. Electronically Signed   By: Elsie Stain M.D.   On: 03/02/2017 16:28   Ct Maxillofacial Wo Contrast  Result Date: 03/02/2017 CLINICAL DATA:  Multiple falls. Right-sided hematoma. Initial encounter. EXAM: CT HEAD WITHOUT CONTRAST CT MAXILLOFACIAL WITHOUT CONTRAST CT CERVICAL SPINE WITHOUT CONTRAST  TECHNIQUE: Multidetector CT imaging of the head, cervical spine, and maxillofacial structures were performed using the standard protocol without intravenous contrast. Multiplanar CT image reconstructions of the cervical spine and maxillofacial structures were also generated. COMPARISON:  Head CT 05/20/2012.  Neck CT 05/21/2012. FINDINGS: CT HEAD FINDINGS Brain: There is no evidence of acute cortical infarct, intracranial hemorrhage, mass, midline shift, or extra-axial fluid collection. Moderate cerebral atrophy is unchanged. Confluent cerebral white matter hypodensities are also unchanged and nonspecific but compatible with extensive chronic small vessel ischemic disease. Vascular: Calcified  atherosclerosis at the skullbase. No hyperdense vessel. Skull: No fracture or focal osseous lesion. Other: Right frontotemporal scalp hematoma. CT MAXILLOFACIAL FINDINGS Osseous: No acute fracture is identified. There is asymmetric anterior translation of the right temporomandibular joint. Orbits: Bilateral cataract extraction.  No acute traumatic finding. Sinuses: Mild left maxillary sinus mucosal thickening. No sinus fluid levels. Clear mastoid air cells and middle ear cavities bilaterally. Soft tissues: Partially visualize right frontotemporal scalp hematoma. CT CERVICAL SPINE FINDINGS Alignment: Grade 1 anterolisthesis of C2 on C3, C4 on C5, C5 on C6, and C7 on T1 and grade 1 retrolisthesis of C3 on C4, most likely all degenerative. No perched facets. Skull base and vertebrae: No acute fracture or suspicious osseous lesion identified. Soft tissues and spinal canal: No prevertebral fluid or swelling. No visible canal hematoma. Disc levels: C1-2 degenerative changes with mild to moderate ligamentous thickening and calcification posterior to the dens. Diffuse cervical disc degeneration, most advanced at C3-4 and C6-7. Advanced diffuse cervical facet arthrosis. Multilevel neural foraminal stenosis, severe on the left at C3-4. Upper chest: Advanced emphysema in the lung apices. Other: Calcific atherosclerosis involving both carotid bifurcations as well as the visualized subclavian arteries. IMPRESSION: 1. No acute intracranial abnormality identified. 2. Moderate cerebral atrophy and extensive chronic small vessel ischemic disease. 3. Right frontotemporal scalp hematoma. 4. No acute cervical spine or maxillofacial fracture identified. 5. Advanced cervical disc and facet degeneration. Multilevel listhesis, likely degenerative. Electronically Signed   By: Sebastian Ache M.D.   On: 03/02/2017 17:08    Assessment/Plan 1. Chronic obstructive pulmonary disease, unspecified COPD type (HCC)  Stable  Continue TID  schedueld Duonebs  Continue Q 3 hour prn Albuterol  2. Essential hypertension  Stable  Continue isosorbide 30 mg po Q day  Continue Metoprolol 25 mg po BID  Family/ staff Communication:   Total Time:  Documentation:  Face to Face:  Family/Phone:   Labs/tests ordered:    Medication list reviewed and assessed for continued appropriateness. Monthly medication orders reviewed and signed.  Brynda Rim, NP-C Geriatrics [facility] [phone] [address], [place] [address] Cell Phone (Mon-Fri 8am-5pm):  [phone] On Call:  [phone] & follow prompts after 5pm & weekends Office Phone:  [phone] Office Fax:  [phone]

## 2017-03-23 DEATH — deceased

## 2018-01-24 IMAGING — CT CT CERVICAL SPINE W/O CM
3 of 11 series · 6 of 33 positions shown, 7 images · non-contrast
Comparison: Head CT 05/20/2012.  Neck CT 05/21/2012.

CLINICAL DATA: Multiple falls. Right-sided hematoma. Initial
encounter.

EXAM:
CT HEAD WITHOUT CONTRAST
CT MAXILLOFACIAL WITHOUT CONTRAST
CT CERVICAL SPINE WITHOUT CONTRAST
TECHNIQUE: Multidetector CT imaging of the head, cervical spine, and
maxillofacial structures were performed using the standard protocol
without intravenous contrast. Multiplanar CT image reconstructions
of the cervical spine and maxillofacial structures were also
generated.

[Series 9: sagittal bone · sagittal · 0.25mm/px · 2 of 49 slices shown]
[im 17/49  bone]
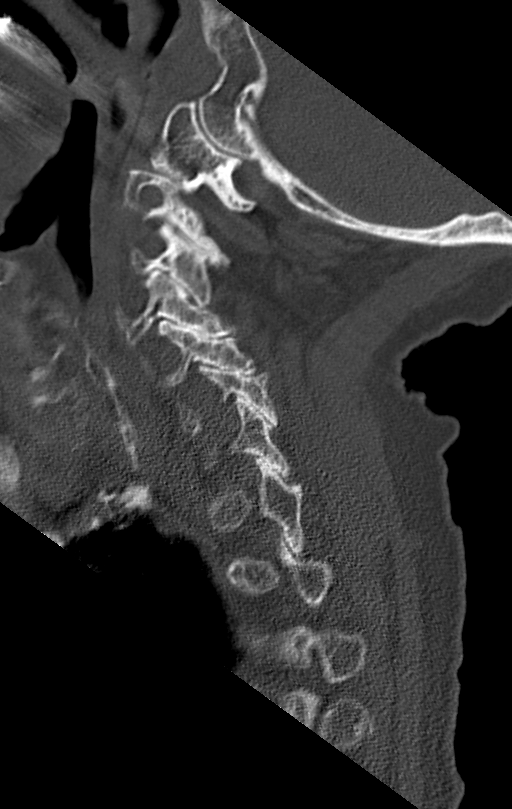
[im 33/49  bone]
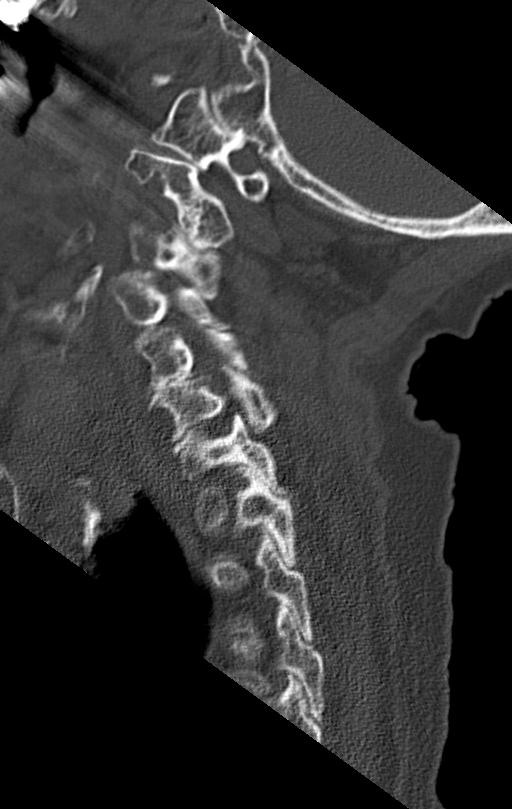

[Series 11: orthogonal axials · axial · 0.27mm/px · z∈[-286,-234]mm · 2 of 94 slices shown, 3 images]
[im 32/94  soft-tissue]
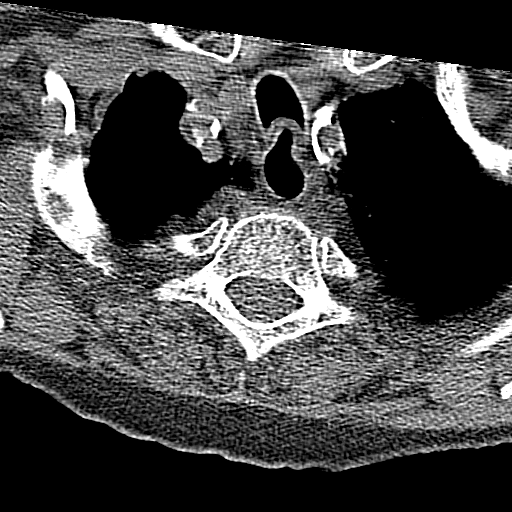
[im 32/94  bone]
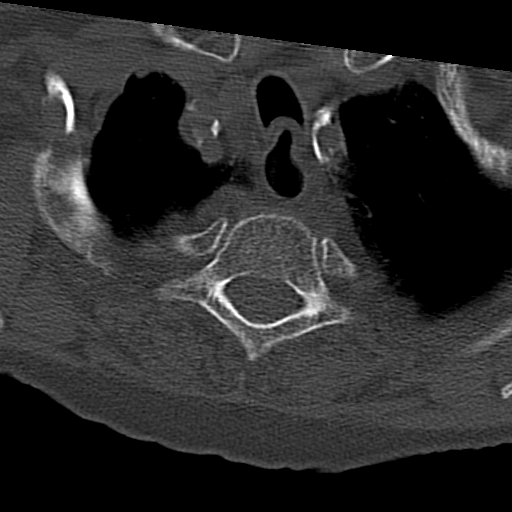
[im 63/94  bone]
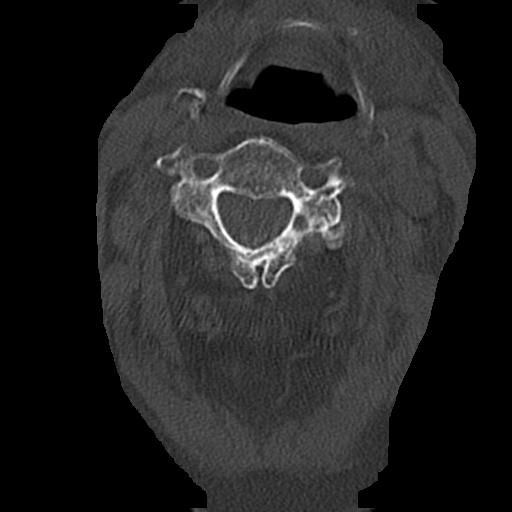

[Series 16: coronal soft · coronal · 0.34mm/px · 2 of 63 slices shown]
[im 21/63  bone]
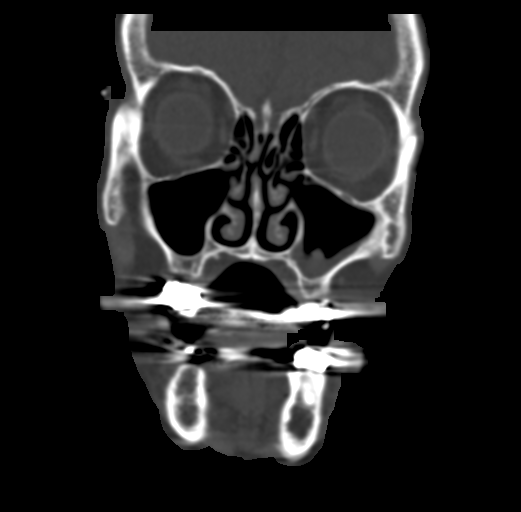
[im 42/63  bone]
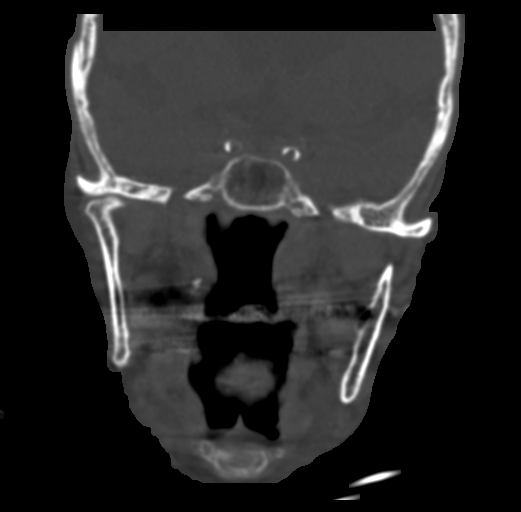

[6 of 33 positions shown; findings below may reference images not displayed]

FINDINGS: CT HEAD FINDINGS

Brain: There is no evidence of acute cortical infarct, intracranial
hemorrhage, mass, midline shift, or extra-axial fluid collection.
Moderate cerebral atrophy is unchanged. Confluent cerebral white
matter hypodensities are also unchanged and nonspecific but
compatible with extensive chronic small vessel ischemic disease.

Vascular: Calcified atherosclerosis at the skullbase. No hyperdense
vessel.

Skull: No fracture or focal osseous lesion.

Other: Right frontotemporal scalp hematoma.

CT MAXILLOFACIAL FINDINGS

Osseous: No acute fracture is identified. There is asymmetric
anterior translation of the right temporomandibular joint.

Orbits: Bilateral cataract extraction.  No acute traumatic finding.

Sinuses: Mild left maxillary sinus mucosal thickening. No sinus
fluid levels. Clear mastoid air cells and middle ear cavities
bilaterally.

Soft tissues: Partially visualize right frontotemporal scalp
hematoma.

CT CERVICAL SPINE FINDINGS

Alignment: Grade 1 anterolisthesis of C2 on C3, C4 on C5, C5 on C6,
and C7 on T1 and grade 1 retrolisthesis of C3 on C4, most likely all
degenerative. No perched facets.

Skull base and vertebrae: No acute fracture or suspicious osseous
lesion identified.

Soft tissues and spinal canal: No prevertebral fluid or swelling. No
visible canal hematoma.

Disc levels: C1-2 degenerative changes with mild to moderate
ligamentous thickening and calcification posterior to the dens.
Diffuse cervical disc degeneration, most advanced at C3-4 and C6-7.
Advanced diffuse cervical facet arthrosis. Multilevel neural
foraminal stenosis, severe on the left at C3-4.

Upper chest: Advanced emphysema in the lung apices.

Other: Calcific atherosclerosis involving both carotid bifurcations
as well as the visualized subclavian arteries.
IMPRESSION: 1. No acute intracranial abnormality identified.
2. Moderate cerebral atrophy and extensive chronic small vessel
ischemic disease.
3. Right frontotemporal scalp hematoma.
4. No acute cervical spine or maxillofacial fracture identified.
5. Advanced cervical disc and facet degeneration. Multilevel
listhesis, likely degenerative.
